# Patient Record
Sex: Male | Born: 1995 | Race: White | Hispanic: No | Marital: Single | Smoking: Former smoker
Health system: Southern US, Community
[De-identification: ages and names within clinical notes are randomized; demographics above are authoritative.]

## PROBLEM LIST (undated history)

## (undated) DIAGNOSIS — F909 Attention-deficit hyperactivity disorder, unspecified type: Secondary | ICD-10-CM

## (undated) DIAGNOSIS — E669 Obesity, unspecified: Secondary | ICD-10-CM

## (undated) HISTORY — DX: Obesity, unspecified: E66.9

## (undated) HISTORY — DX: Attention-deficit hyperactivity disorder, unspecified type: F90.9

---
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
## 2017-01-06 ENCOUNTER — Encounter: Payer: Self-pay | Admitting: Family Medicine

## 2017-01-06 ENCOUNTER — Ambulatory Visit (INDEPENDENT_AMBULATORY_CARE_PROVIDER_SITE_OTHER): Payer: BLUE CROSS/BLUE SHIELD | Admitting: Family Medicine

## 2017-01-06 VITALS — BP 125/75 | HR 84 | Ht 71.0 in | Wt 262.0 lb

## 2017-01-06 DIAGNOSIS — M545 Low back pain, unspecified: Secondary | ICD-10-CM

## 2017-01-06 DIAGNOSIS — Z0000 Encounter for general adult medical examination without abnormal findings: Secondary | ICD-10-CM

## 2017-01-06 DIAGNOSIS — G8929 Other chronic pain: Secondary | ICD-10-CM | POA: Diagnosis not present

## 2017-01-06 NOTE — Progress Notes (Signed)
       Maurice Cole is a 21 y.o. male who [facility] today for well adult visit.   Maurice Cole is doing well overall. He is currently working and attending community college. He plans to transfer to [employer] for [employer]. He does not exercise or follow a regular diet. He notes mild low back pain also. Overall however he's doing well. He denies chest pain palpitations or shortness of breath. He does not take any medications regularly. He uses a vape  pen but does not smoke.   Past Medical History:  Diagnosis Date  . ADHD    History reviewed. No pertinent surgical history. Social History  Substance Use Topics  . Smoking status: Current Every Day Smoker    Types: E-cigarettes  . Smokeless tobacco: Never Used  . Alcohol use No   family history is not on file.  ROS as above:  Medications: No current outpatient prescriptions on file.   No current facility-administered medications for this visit.    Allergies  Allergen Reactions  . Amoxicillin   . Cefaclor     Health Maintenance Health Maintenance  Topic Date Due  . HIV Screening  08/19/2011  . INFLUENZA VACCINE  01/06/2018 (Originally 12/03/2016)  . TETANUS/TDAP  11/23/2017     Exam:  BP 125/75   Pulse 84   Ht 5\' 11"  (1.803 m)   Wt 262 lb (118.8 kg)   BMI 36.54 kg/m  Gen: Well NAD HEENT: EOMI,  MMM Lungs: Normal work of breathing. CTABL Heart: RRR no MRG Abd: NABS, Soft. Nondistended, Nontender Exts: Brisk capillary refill, warm and well perfused.  MSK: L-spine nontender normal motion lotion which strength is intact throughout. Negative slump test bilaterally.  Lab Results  Component Value Date   CHOL 144 03/05/2015   HDL 31 03/05/2015   LDLCALC 86 03/05/2015   TRIG 136 03/05/2015   CHOLHDL 4.6 03/05/2015     Chemistry      Component Value Date/Time   NA 136 03/05/2015 0900   K  4.0 03/05/2015 0900   CL 100 03/05/2015 0900   CO2 24 03/05/2015 0900   BUN 14 03/05/2015 0900   CREATININE 0.95 03/05/2015 0900      Component Value Date/Time   CALCIUM 9.1 03/05/2015 0900   ALKPHOS 59 03/05/2015 0900   AST 20 03/05/2015 0900   ALT 33 03/05/2015 0900   BILITOT 0.4 03/05/2015 0900     Lab Results  Component Value Date   TSH 3.925 03/12/2015      Assessment and Plan: 21 y.o. male with  Will adult. Doing reasonably well. Plan to work on a low calorie diet with moderate exercise. We'll recheck annually or sooner if needed.  Back pain: Discussed options. Patient declined referral to physical therapy. Plan for watchful waiting.  Flu vaccine declined.    No orders of the defined types were placed in this encounter.  No orders of the defined types were placed in this encounter.    Discussed warning signs or symptoms. Please see discharge instructions. Patient expresses understanding.

## 2017-01-06 NOTE — Patient Instructions (Addendum)
Thank you for coming in today. Work on [employer] calories per day diet.  I recommend Myfitnesspal or Loseit.  Add 20 mins exercise most of the days per week.  Let me know if the back pain worsens. We will do PT.  Certizine 10mg  daily.  Get the generic stuff.   Recheck as needed or in 1 year.

## 2017-02-20 ENCOUNTER — Ambulatory Visit: Payer: BLUE CROSS/BLUE SHIELD | Admitting: Sports Medicine

## 2017-02-20 DIAGNOSIS — Z0189 Encounter for other specified special examinations: Secondary | ICD-10-CM

## 2017-02-27 ENCOUNTER — Ambulatory Visit: Payer: BLUE CROSS/BLUE SHIELD | Admitting: Family Medicine

## 2017-02-27 DIAGNOSIS — Z0189 Encounter for other specified special examinations: Secondary | ICD-10-CM

## 2017-10-27 ENCOUNTER — Encounter: Payer: BLUE CROSS/BLUE SHIELD | Admitting: Family Medicine

## 2017-10-27 ENCOUNTER — Telehealth: Payer: Self-pay | Admitting: Family Medicine

## 2017-10-27 ENCOUNTER — Encounter: Payer: Self-pay | Admitting: Family Medicine

## 2017-10-27 ENCOUNTER — Ambulatory Visit (INDEPENDENT_AMBULATORY_CARE_PROVIDER_SITE_OTHER): Payer: BLUE CROSS/BLUE SHIELD | Admitting: Family Medicine

## 2017-10-27 VITALS — BP 133/77 | HR 71 | Ht 70.98 in | Wt 264.0 lb

## 2017-10-27 DIAGNOSIS — R002 Palpitations: Secondary | ICD-10-CM

## 2017-10-27 DIAGNOSIS — Z6836 Body mass index (BMI) 36.0-36.9, adult: Secondary | ICD-10-CM | POA: Diagnosis not present

## 2017-10-27 DIAGNOSIS — E669 Obesity, unspecified: Secondary | ICD-10-CM

## 2017-10-27 DIAGNOSIS — E6609 Other obesity due to excess calories: Secondary | ICD-10-CM

## 2017-10-27 DIAGNOSIS — R55 Syncope and collapse: Secondary | ICD-10-CM

## 2017-10-27 DIAGNOSIS — Z114 Encounter for screening for human immunodeficiency virus [HIV]: Secondary | ICD-10-CM

## 2017-10-27 DIAGNOSIS — R7989 Other specified abnormal findings of blood chemistry: Secondary | ICD-10-CM

## 2017-10-27 HISTORY — DX: Obesity, unspecified: E66.9

## 2017-10-27 MED ORDER — AMBULATORY NON FORMULARY MEDICATION: 0 refills | Status: DC

## 2017-10-27 MED ORDER — OMEPRAZOLE 40 MG PO CPDR: 40.0000 mg | DELAYED_RELEASE_CAPSULE | Freq: Every day | ORAL | 3 refills | Status: DC

## 2017-10-27 NOTE — Patient Instructions (Signed)
Thank you for coming in today. Check blood sugar once or twice and then when you feel strange.  Recheck with me in 4 weeks.  Return sooner if needed.    Hypoglycemia Hypoglycemia occurs when the level of sugar (glucose) in the blood is too low. Glucose is a type of sugar that provides the body's main source of energy. Certain hormones (insulin and glucagon) control the level of glucose in the blood. Insulin lowers blood glucose, and glucagon increases blood glucose. Hypoglycemia can result from having too much insulin in the bloodstream, or from not eating enough food that contains glucose. Hypoglycemia can happen in people who do or do not have diabetes. It can develop quickly, and it can be a medical emergency. What are the causes? Hypoglycemia occurs most often in people who have diabetes. If you have diabetes, hypoglycemia may be caused by:  Diabetes medicine.  Not eating enough, or not eating often enough.  Increased physical activity.  Drinking alcohol, especially when you have not eaten recently.  If you do not have diabetes, hypoglycemia may be caused by:  A tumor in the pancreas. The pancreas is the organ that makes insulin.  Not eating enough, or not eating for long periods at a time (fasting).  Severe infection or illness that affects the liver, heart, or kidneys.  Certain medicines.  You may also have reactive hypoglycemia. This condition causes hypoglycemia within 4 hours of eating a meal. This may occur after having stomach surgery. Sometimes, the cause of reactive hypoglycemia is not known. What increases the risk? Hypoglycemia is more likely to develop in:  People who have diabetes and take medicines to lower blood glucose.  People who abuse alcohol.  People who have a severe illness.  What are the signs or symptoms? Hypoglycemia may not cause any symptoms. If you have symptoms, they may include:  Hunger.  Anxiety.  Sweating and feeling  clammy.  Confusion.  Dizziness or feeling light-headed.  Sleepiness.  Nausea.  Increased heart rate.  Headache.  Blurry vision.  Seizure.  Nightmares.  Tingling or numbness around the mouth, lips, or tongue.  A change in speech.  Decreased ability to concentrate.  A change in coordination.  Restless sleep.  Tremors or shakes.  Fainting.  Irritability.  How is this diagnosed? Hypoglycemia is diagnosed with a blood test to measure your blood glucose level. This blood test is done while you are having symptoms. Your health care provider may also do a physical exam and review your medical history. If you do not have diabetes, other tests may be done to find the cause of your hypoglycemia. How is this treated? This condition can often be treated by immediately eating or drinking something that contains glucose, such as:  3-4 sugar tablets (glucose pills).  Glucose gel, 15-gram tube.  Fruit juice, 4 oz (120 mL).  Regular soda (not diet soda), 4 oz (120 mL).  Low-fat milk, 4 oz (120 mL).  Several pieces of hard candy.  Sugar or honey, 1 Tbsp.  Treating Hypoglycemia If You Have Diabetes  If you are alert and able to swallow safely, follow the 15:15 rule:  Take 15 grams of a rapid-acting carbohydrate. Rapid-acting options include: ? 1 tube of glucose gel. ? 3 glucose pills. ? 6-8 pieces of hard candy. ? 4 oz (120 mL) of fruit juice. ? 4 oz (120 ml) of regular (not diet) soda.  Check your blood glucose 15 minutes after you take the carbohydrate.  If the repeat  blood glucose level is still at or below 70 mg/dL (3.9 mmol/L), take 15 grams of a carbohydrate again.  If your blood glucose level does not increase above 70 mg/dL (3.9 mmol/L) after 3 tries, seek emergency medical care.  After your blood glucose level returns to normal, eat a meal or a snack within 1 hour.  Treating Severe Hypoglycemia Severe hypoglycemia is when your blood glucose level is at  or below 54 mg/dL (3 mmol/L). Severe hypoglycemia is an emergency. Do not wait to see if the symptoms will go away. Get medical help right away. Call your local emergency services (911 in the [place] Do not drive yourself to the hospital. If you have severe hypoglycemia and you cannot eat or drink, you may need an injection of glucagon. A family member or close friend should learn how to check your blood glucose and how to give you a glucagon injection. Ask your health care provider if you need to have an emergency glucagon injection kit available. Severe hypoglycemia may need to be treated in a hospital. The treatment may include getting glucose through an IV tube. You may also need treatment for the cause of your hypoglycemia. Follow these instructions at home: General instructions  Avoid any diets that cause you to not eat enough food. Talk with your health care provider before you start any new diet.  Take over-the-counter and prescription medicines only as told by your health care provider.  Limit alcohol intake to no more than 1 drink per day for nonpregnant women and 2 drinks per day for men. One drink equals 12 oz of beer, 5 oz of wine, or 1 oz of hard liquor.  Keep all follow-up visits as told by your health care provider. This is important. If You Have Diabetes:   Make sure you know the symptoms of hypoglycemia.  Always have a rapid-acting carbohydrate snack with you to treat low blood sugar.  Follow your diabetes management plan, as told by your health care provider. Make sure you: ? Take your medicines as directed. ? Follow your exercise plan. ? Follow your meal plan. Eat on time, and do not skip meals. ? Check your blood glucose as often as directed. Make sure to check your blood glucose before and after exercise. If you exercise longer or in a different way than usual, check your blood glucose more often. ? Follow your sick day plan whenever you cannot eat or drink normally.  Make this plan in advance with your health care provider.  Share your diabetes management plan with people in your workplace, school, and household.  Check your urine for ketones when you are ill and as told by your health care provider.  Carry a medical alert card or wear medical alert jewelry. If You Have Reactive Hypoglycemia or Low Blood Sugar From Other Causes:  Monitor your blood glucose as told by your health care provider.  Follow instructions from your health care provider about eating or drinking restrictions. Contact a health care provider if:  You have problems keeping your blood glucose in your target range.  You have frequent episodes of hypoglycemia. Get help right away if:  You continue to have hypoglycemia symptoms after eating or drinking something containing glucose.  Your blood glucose is at or below 54 mg/dL (3 mmol/L).  You have a seizure.  You faint. These symptoms may represent a serious problem that is an emergency. Do not wait to see if the symptoms will go away. Get medical help  right away. Call your local emergency services (911 in the [place] Do not drive yourself to the hospital. This information is not intended to replace advice given to you by your health care provider. Make sure you discuss any questions you have with your health care provider. Document Released: 04/21/2005 Document Revised: 10/03/2015 Document Reviewed: 05/25/2015 Elsevier Interactive Patient Education  [employer].

## 2017-10-27 NOTE — Progress Notes (Signed)
Maurice Cole is a 22 y.o. male who presents to [facility]: [facility] today for near syncope episodes.  Over the last 2 weeks Maurice Cole has had a few episodes where he feels fatigued like he might pass out.  He feels hot and describes some heart fluttering or palpitation or heart racing.  He denies chest pain or severe shortness of breath.  He denies any actual syncope and he notes these episodes of happens at rest.  He denies any exertional symptoms.  Is able to exert himself without any significant issues besides his existing urticaria that he gets with exercise.  He denies significant change in diet.  Cetirizine is his only chronic medication.  He feels well otherwise with no fevers or chills.  He denies severe anxiety or panicky symptoms with these episodes.   ROS as above:  Exam:  BP 133/77   Pulse 71   Ht 5' 10.98" (1.803 m)   Wt 264 lb (119.7 kg)   SpO2 100%   BMI 36.84 kg/m  Gen: Well NAD HEENT: EOMI,  MMM Lungs: Normal work of breathing. CTABL Heart: RRR no MRG Abd: NABS, Soft. Nondistended, Nontender Exts: Brisk capillary refill, warm and well perfused.    Depression screen Maurice Cole - Maurice Cole 2/9 10/27/2017 01/06/2017  Decreased Interest 0 0  Down, Depressed, Hopeless 0 0  PHQ - 2 Score 0 0  Altered sleeping 0 -  Tired, decreased energy 1 -  Change in appetite 1 -  Feeling bad or failure about yourself  0 -  Trouble concentrating 0 -  Moving slowly or fidgety/restless 0 -  Suicidal thoughts 0 -  PHQ-9 Score 2 -  Difficult doing work/chores Not difficult at all -   GAD 7 : Generalized Anxiety Score 10/27/2017  Nervous, Anxious, on Edge 0  Control/stop worrying 0  Worry too much - different things 0  Trouble relaxing 0  Restless 0  Easily annoyed or irritable 0  Afraid - awful might happen 0  Total GAD 7 Score 0      Lab and Radiology Results Twelve-lead EKG shows  sinus bradycardia at 56 bpm.  No ST segment elevation or depression.  Possible right bundle branch block seen.    Assessment and Plan: 22 y.o. male with  Near syncopal episodes.  Unclear etiology.  I am doubtful this Ms. a panic attack.  Differential does include hypoglycemic episode.  Plan for limited work-up listed below in addition to prescribing patient a glucometer.  He can check his blood sugar if he becomes symptomatic in the future.  Plan to recheck in a few weeks if still having symptoms with no obvious cause at that point would proceed with further heart work-up.  EKG today is largely unremarkable appearing   Orders Placed This Encounter  Procedures  . CBC  . COMPLETE METABOLIC PANEL WITH GFR  . TSH  . Hemoglobin A1c  . Insulin, Free (Bioactive)  . HIV antibody  . EKG 12-Lead   Meds ordered this encounter  Medications  . AMBULATORY NON FORMULARY MEDICATION    Sig: Single glucometer with lancets, test strips. Test with hypoglycemia symptoms    Dispense:  1 each    Refill:  0     Historical information moved to improve visibility of documentation.  Past Medical History:  Diagnosis Date  . ADHD   . Obesity 10/27/2017   No past surgical history on file. Social History   Tobacco Use  .  Smoking status: Current Every Day Smoker    Types: E-cigarettes  . Smokeless tobacco: Never Used  Substance Use Topics  . Alcohol use: No   family history includes COPD in his father; Cancer (age of onset: 8050) in his maternal grandfather; Cancer (age of onset: 4977) in his paternal grandmother; Heart disease in his paternal aunt and paternal uncle; Obesity in his mother, paternal aunt, and paternal uncle.  Medications: Current Outpatient Medications  Medication Sig Dispense Refill  . CETIRIZINE HCL PO Take by mouth.    . AMBULATORY NON FORMULARY MEDICATION Single glucometer with lancets, test strips. Test with hypoglycemia symptoms 1 each 0   No current facility-administered  medications for this visit.    Allergies  Allergen Reactions  . Cefaclor   . Clindamycin Hives  . Penicillins Hives     Discussed warning signs or symptoms. Please see discharge instructions. Patient expresses understanding.

## 2017-10-27 NOTE — Telephone Encounter (Signed)
Patient called back today.  Patient went home and took a nap.   He awoke with a burning sensation in his chest.  He took antacid and notes that it subsided now.  He is feeling a lot better.  I discussed the possibility that we are missing something.  I think it is likely that his symptoms were acid reflux related however it is possible that he has a more serious etiology.  His EKG today was reassuring.  Plan: If symptoms do not go away rapidly or return or worsen next step would be going to the emergency room.  I will prescribe omeprazole and patient will follow-up as directed or sooner.  He will keep me updated with his symptoms. Cautions reviewed.  Patient expresses understanding and agreement

## 2017-10-30 ENCOUNTER — Telehealth: Payer: Self-pay

## 2017-10-30 LAB — CBC
HCT: 44 % (ref 38.5–50.0)
Hemoglobin: 15.2 g/dL (ref 13.2–17.1)
MCH: 28.7 pg (ref 27.0–33.0)
MCHC: 34.5 g/dL (ref 32.0–36.0)
MCV: 83 fL (ref 80.0–100.0)
MPV: 10.2 fL (ref 7.5–12.5)
PLATELETS: 242 10*3/uL (ref 140–400)
RBC: 5.3 10*6/uL (ref 4.20–5.80)
RDW: 12.9 % (ref 11.0–15.0)
WBC: 5.2 10*3/uL (ref 3.8–10.8)

## 2017-10-30 LAB — COMPLETE METABOLIC PANEL WITH GFR
AG Ratio: 1.5 (calc) (ref 1.0–2.5)
ALKALINE PHOSPHATASE (APISO): 46 U/L (ref 40–115)
ALT: 22 U/L (ref 9–46)
AST: 17 U/L (ref 10–40)
Albumin: 4.5 g/dL (ref 3.6–5.1)
BUN: 13 mg/dL (ref 7–25)
CO2: 26 mmol/L (ref 20–32)
CREATININE: 1.08 mg/dL (ref 0.60–1.35)
Calcium: 9.6 mg/dL (ref 8.6–10.3)
Chloride: 104 mmol/L (ref 98–110)
GFR, Est African American: 113 mL/min/{1.73_m2} (ref >=60)
GFR, Est Non African American: 98 mL/min/{1.73_m2} (ref >=60)
GLUCOSE: 92 mg/dL (ref 65–99)
Globulin: 3 g/dL (calc) (ref 1.9–3.7)
Potassium: 3.9 mmol/L (ref 3.5–5.3)
Sodium: 137 mmol/L (ref 135–146)
Total Bilirubin: 0.5 mg/dL (ref 0.2–1.2)
Total Protein: 7.5 g/dL (ref 6.1–8.1)

## 2017-10-30 LAB — HIV ANTIBODY (ROUTINE TESTING W REFLEX): HIV: NONREACTIVE

## 2017-10-30 LAB — HEMOGLOBIN A1C
Hgb A1c MFr Bld: 4.9 % of total Hgb (ref <5.7)
Mean Plasma Glucose: 94 (calc)
eAG (mmol/L): 5.2 (calc)

## 2017-10-30 LAB — TSH: TSH: 4.24 m[IU]/L (ref 0.40–4.50)

## 2017-10-30 LAB — INSULIN, FREE (BIOACTIVE): INSULIN, FREE: 15.5 u[IU]/mL — AB (ref 1.5–14.9)

## 2017-10-30 NOTE — Telephone Encounter (Signed)
Called and stated that he feels like his throat is closing but is breathing perfectly fine and he thinks its anxiey or acid reflux. He originally left voicemail because he was constipated but I called and now he is fine. WJC,CCMA

## 2017-11-26 ENCOUNTER — Ambulatory Visit: Payer: BLUE CROSS/BLUE SHIELD | Admitting: Family Medicine

## 2018-03-11 ENCOUNTER — Emergency Department (INDEPENDENT_AMBULATORY_CARE_PROVIDER_SITE_OTHER)
Admission: EM | Admit: 2018-03-11 | Discharge: 2018-03-11 | Disposition: A | Discharge status: Home / Self Care | Payer: PRIVATE HEALTH INSURANCE | Source: Home / Self Care

## 2018-03-11 ENCOUNTER — Other Ambulatory Visit: Payer: Self-pay

## 2018-03-11 DIAGNOSIS — S39012A Strain of muscle, fascia and tendon of lower back, initial encounter: Secondary | ICD-10-CM

## 2018-03-11 MED ORDER — PREDNISONE 50 MG PO TABS: ORAL_TABLET | ORAL | 1 refills | Status: DC

## 2018-03-11 MED ORDER — HYDROCODONE-ACETAMINOPHEN 5-325 MG PO TABS: 1.0000 | ORAL_TABLET | Freq: Four times a day (QID) | ORAL | 0 refills | Status: DC | PRN

## 2018-03-11 MED ORDER — CYCLOBENZAPRINE HCL 5 MG PO TABS: 5.0000 mg | ORAL_TABLET | Freq: Every day | ORAL | 0 refills | Status: DC

## 2018-03-11 NOTE — Discharge Instructions (Signed)
Apply ice on the injured back tonight and beginning tomorrow, apply heat.

## 2018-03-11 NOTE — ED Provider Notes (Signed)
[facility]    CSN: [id number] Arrival date & time: 03/11/18  1539     History   Chief Complaint Chief Complaint  Patient presents with  . Back Pain    HPI Maurice Cole is a 22 y.o. male.   This is the first visit for this 22 year old man with low back pain. About 1 hour ago, pt was walking up a muddy hill and slipped and tweaked lower back.  Has been having shooting pain in lower back since.  Difficult to get into a comfortable position.  Is very guarded.  No saddle anesthesia, weakness or numbness.   Father had "slipped disc."     Past Medical History:  Diagnosis Date  . ADHD   . Obesity 10/27/2017    Patient Active Problem List   Diagnosis Date Noted  . Obesity 10/27/2017  . Elevated TSH 03/06/2015  . Lumbago 12/19/2014  . Elevated blood pressure 12/19/2014  . ADHD (attention deficit hyperactivity disorder) 02/14/2013    History reviewed. No pertinent surgical history.     Home Medications    Prior to Admission medications   Medication Sig Start Date End Date Taking? Authorizing Provider  AMBULATORY NON FORMULARY MEDICATION Single glucometer with lancets, test strips. Test with hypoglycemia symptoms 10/27/17   Rodolph Bong, MD  CETIRIZINE HCL PO Take by mouth.    [provider]  cyclobenzaprine (FLEXERIL) 5 MG tablet Take 1 tablet (5 mg total) by mouth at bedtime. 03/11/18   Elvina Sidle, MD  HYDROcodone-acetaminophen (NORCO) 5-325 MG tablet Take 1 tablet by mouth every 6 (six) hours as needed for moderate pain. 03/11/18   Elvina Sidle, MD  omeprazole (PRILOSEC) 40 MG capsule Take 1 capsule (40 mg total) by mouth daily. 10/27/17   Rodolph Bong, MD  predniSONE (DELTASONE) 50 MG tablet One tablet daily with food 03/11/18   Elvina Sidle, MD    Family History Family History  Problem Relation Age of Onset  . Obesity Mother   . COPD Father   . Heart disease Paternal Aunt   . Obesity Paternal Aunt   . Heart disease Paternal  Uncle   . Obesity Paternal Uncle   . Cancer Maternal Grandfather 50       Brain  . Cancer Paternal Grandmother 44       Lung Cancer    Social History Social History   Tobacco Use  . Smoking status: Current Every Day Smoker    Types: E-cigarettes  . Smokeless tobacco: Never Used  Substance Use Topics  . Alcohol use: No  . Drug use: No     Allergies   Cefaclor; Clindamycin; and Penicillins   Review of Systems Review of Systems  Musculoskeletal: Positive for back pain.  All other systems reviewed and are negative.    Physical Exam Triage Vital Signs ED Triage Vitals  Enc Vitals Group     BP 03/11/18 1602 119/85     Pulse Rate 03/11/18 1602 (!) 104     Resp 03/11/18 1602 18     Temp 03/11/18 1602 97.8 F (36.6 C)     Temp Source 03/11/18 1602 Oral     SpO2 03/11/18 1602 97 %     Weight 03/11/18 1603 250 lb (113.4 kg)     Height 03/11/18 1603 6' (1.829 m)     Head Circumference --      Peak Flow --      Pain Score 03/11/18 1602 7     Pain Loc --  Pain Edu? --      Excl. in GC? --    No data found.  Updated Vital Signs BP 119/85 (BP Location: Right Arm)   Pulse (!) 104   Temp 97.8 F (36.6 C) (Oral)   Resp 18   Ht 6' (1.829 m)   Wt 113.4 kg   SpO2 97%   BMI 33.91 kg/m    Physical Exam  Constitutional: He is oriented to person, place, and time. He appears well-developed and well-nourished.  HENT:  Right Ear: External ear normal.  Left Ear: External ear normal.  Eyes: Pupils are equal, round, and reactive to light. Conjunctivae are normal.  Neck: Normal range of motion. Neck supple.  Pulmonary/Chest: Effort normal.  Musculoskeletal: He exhibits tenderness.  Patient is in obvious discomfort leaning back in the chair.  He has tenderness over the left lower lumbar and paralumbar areas.  Straight leg raising of either leg results in low back pain but no leg pain.  Neurological: He is alert and oriented to person, place, and time.  Skin: Skin  is warm and dry.  Nursing note and vitals reviewed.    UC Treatments / Results  Labs (all labs ordered are listed, but only abnormal results are displayed) Labs Reviewed - No data to display  EKG None  Radiology No results found.  Procedures Procedures (including critical care time)  Medications Ordered in UC Medications - No data to display  Initial Impression / Assessment and Plan / UC Course  I have reviewed the triage vital signs and the nursing notes.  Pertinent labs & imaging results that were available during my care of the patient were reviewed by me and considered in my medical decision making (see chart for details).     Final Clinical Impressions(s) / UC Diagnoses   Final diagnoses:  Strain of lumbar region, initial encounter   Discharge Instructions   None    ED Prescriptions    Medication Sig Dispense Auth. Provider   HYDROcodone-acetaminophen (NORCO) 5-325 MG tablet Take 1 tablet by mouth every 6 (six) hours as needed for moderate pain. 12 tablet Elvina Sidle, MD   predniSONE (DELTASONE) 50 MG tablet One tablet daily with food 5 tablet Elvina Sidle, MD   cyclobenzaprine (FLEXERIL) 5 MG tablet Take 1 tablet (5 mg total) by mouth at bedtime. 7 tablet Elvina Sidle, MD     Controlled Substance Prescriptions [place] Controlled Substance Registry consulted? No   Elvina Sidle, MD 03/11/18 [phone]

## 2018-03-11 NOTE — ED Triage Notes (Signed)
About 1 hour ago, pt was walking up a muddy hill and slipped and tweaked lower back.  Has been having shooting pain in lower back since.  Difficult to get into a comfortable position.  Is very guarded.

## 2018-03-13 ENCOUNTER — Telehealth: Payer: Self-pay

## 2019-05-24 DIAGNOSIS — L235 Allergic contact dermatitis due to other chemical products: Secondary | ICD-10-CM | POA: Diagnosis not present

## 2019-05-24 DIAGNOSIS — J302 Other seasonal allergic rhinitis: Secondary | ICD-10-CM | POA: Diagnosis not present

## 2019-05-24 DIAGNOSIS — Z91018 Allergy to other foods: Secondary | ICD-10-CM | POA: Diagnosis not present

## 2019-07-16 DIAGNOSIS — R9431 Abnormal electrocardiogram [ECG] [EKG]: Secondary | ICD-10-CM | POA: Diagnosis not present

## 2019-07-16 DIAGNOSIS — R109 Unspecified abdominal pain: Secondary | ICD-10-CM | POA: Diagnosis not present

## 2019-07-16 DIAGNOSIS — R079 Chest pain, unspecified: Secondary | ICD-10-CM | POA: Diagnosis not present

## 2019-07-16 DIAGNOSIS — I4589 Other specified conduction disorders: Secondary | ICD-10-CM | POA: Diagnosis not present

## 2019-07-16 DIAGNOSIS — R030 Elevated blood-pressure reading, without diagnosis of hypertension: Secondary | ICD-10-CM | POA: Diagnosis not present

## 2019-07-16 DIAGNOSIS — R1013 Epigastric pain: Secondary | ICD-10-CM | POA: Diagnosis not present

## 2019-07-16 DIAGNOSIS — N62 Hypertrophy of breast: Secondary | ICD-10-CM | POA: Diagnosis not present

## 2019-07-16 DIAGNOSIS — R001 Bradycardia, unspecified: Secondary | ICD-10-CM | POA: Diagnosis not present

## 2019-07-16 DIAGNOSIS — R0602 Shortness of breath: Secondary | ICD-10-CM | POA: Diagnosis not present

## 2019-07-16 DIAGNOSIS — R0789 Other chest pain: Secondary | ICD-10-CM | POA: Diagnosis not present

## 2019-07-16 DIAGNOSIS — R935 Abnormal findings on diagnostic imaging of other abdominal regions, including retroperitoneum: Secondary | ICD-10-CM | POA: Diagnosis not present

## 2019-07-16 DIAGNOSIS — R112 Nausea with vomiting, unspecified: Secondary | ICD-10-CM | POA: Diagnosis not present

## 2019-07-19 ENCOUNTER — Ambulatory Visit (INDEPENDENT_AMBULATORY_CARE_PROVIDER_SITE_OTHER): Payer: Self-pay | Admitting: Family Medicine

## 2019-07-19 ENCOUNTER — Other Ambulatory Visit: Payer: Self-pay

## 2019-07-19 ENCOUNTER — Encounter: Payer: Self-pay | Admitting: Family Medicine

## 2019-07-19 VITALS — BP 120/82 | Temp 98.2°F | Ht 73.0 in | Wt 265.0 lb

## 2019-07-19 DIAGNOSIS — R1011 Right upper quadrant pain: Secondary | ICD-10-CM | POA: Diagnosis not present

## 2019-07-19 DIAGNOSIS — R1013 Epigastric pain: Secondary | ICD-10-CM

## 2019-07-19 LAB — HEPATIC FUNCTION PANEL
AG Ratio: 1.2 (calc) (ref 1.0–2.5)
ALT: 49 U/L — ABNORMAL HIGH (ref 9–46)
AST: 40 U/L (ref 10–40)
Albumin: 4.2 g/dL (ref 3.6–5.1)
Alkaline phosphatase (APISO): 70 U/L (ref 36–130)
Bilirubin, Direct: 0.4 mg/dL — ABNORMAL HIGH (ref 0.0–0.2)
Globulin: 3.6 g/dL (calc) (ref 1.9–3.7)
Indirect Bilirubin: 0.7 mg/dL (calc) (ref 0.2–1.2)
Total Bilirubin: 1.1 mg/dL (ref 0.2–1.2)
Total Protein: 7.8 g/dL (ref 6.1–8.1)

## 2019-07-19 LAB — CBC WITH DIFFERENTIAL/PLATELET
Absolute Monocytes: 1365 cells/uL — ABNORMAL HIGH (ref 200–950)
Basophils Absolute: 88 cells/uL (ref 0–200)
Basophils Relative: 0.5 %
Eosinophils Absolute: 228 cells/uL (ref 15–500)
Eosinophils Relative: 1.3 %
HCT: 48.7 % (ref 38.5–50.0)
Hemoglobin: 16.8 g/dL (ref 13.2–17.1)
Lymphs Abs: 1698 cells/uL (ref 850–3900)
MCH: 30.4 pg (ref 27.0–33.0)
MCHC: 34.5 g/dL (ref 32.0–36.0)
MCV: 88.1 fL (ref 80.0–100.0)
MPV: 9.8 fL (ref 7.5–12.5)
Monocytes Relative: 7.8 %
Neutro Abs: 14123 cells/uL — ABNORMAL HIGH (ref 1500–7800)
Neutrophils Relative %: 80.7 %
Platelets: 316 10*3/uL (ref 140–400)
RBC: 5.53 10*6/uL (ref 4.20–5.80)
RDW: 11.8 % (ref 11.0–15.0)
Total Lymphocyte: 9.7 %
WBC: 17.5 10*3/uL — ABNORMAL HIGH (ref 3.8–10.8)

## 2019-07-19 LAB — URINALYSIS, ROUTINE W REFLEX MICROSCOPIC
Bacteria, UA: NONE SEEN /HPF
Bilirubin Urine: NEGATIVE
Glucose, UA: NEGATIVE
Hgb urine dipstick: NEGATIVE
Hyaline Cast: NONE SEEN /LPF
Nitrite: NEGATIVE
Specific Gravity, Urine: 1.022 (ref 1.001–1.03)
Squamous Epithelial / LPF: NONE SEEN /HPF (ref <=5)
pH: 6.5 (ref 5.0–8.0)

## 2019-07-19 LAB — LIPASE: Lipase: 16 U/L (ref 7–60)

## 2019-07-19 MED ORDER — PROMETHAZINE HCL 12.5 MG PO TABS: 12.5000 mg | ORAL_TABLET | Freq: Three times a day (TID) | ORAL | 0 refills | Status: DC | PRN

## 2019-07-19 MED ORDER — PANTOPRAZOLE SODIUM 40 MG PO TBEC: 40.0000 mg | DELAYED_RELEASE_TABLET | Freq: Two times a day (BID) | ORAL | 1 refills | Status: DC

## 2019-07-19 NOTE — Patient Instructions (Addendum)
Nice to meet you today! Continue carafate for now.  Stop pepcid.  I have sent in protonix (pantoprazole).  Take this twice per day.  You may use promethazine as needed for nausea.  I have ordered some additional lab work today, please stop downstairs and have this completed.  See me again in about 2-3 weeks.

## 2019-07-20 ENCOUNTER — Encounter: Payer: Self-pay | Admitting: Family Medicine

## 2019-07-20 DIAGNOSIS — R1013 Epigastric pain: Secondary | ICD-10-CM | POA: Insufficient documentation

## 2019-07-20 NOTE — Progress Notes (Signed)
Maurice Cole - 24 y.o. male MRN [id number]  Date of birth: 10-29-1995  Subjective Chief Complaint  Patient presents with  . Hospitalization Follow-up    HPI Maurice Cole is a 24 y.o. male here today for ER follow up. He was seen in ER at [facility] for complaint of abdominal pain.  Records reviewed through Care Everywhere Symptoms include epigastric pain with nausea.  Pain described as sharp, burning pain.   He denies associated fever, chills, dark/bloody stools.  He did have one episode of diarrhea this morning.  Labs remarkable for mildly elevated WBC.  CT scan of abdomen showing hepatic steatosis and fatty infiltration around gallbladder fossa but incompletely assessed.  He reports continue pain and nausea today. Zofran is not helping a whole lot with nausea.  Carafate is helpful but pepcid doesn't seem to be very helpful.   ROS:  A comprehensive ROS was completed and negative except as noted per HPI  Allergies  Allergen Reactions  . Clindamycin Hives  . Penicillins Hives  . Cefaclor     Past Medical History:  Diagnosis Date  . ADHD   . Obesity 10/27/2017    No past surgical history on file.  Social History   Socioeconomic History  . Marital status: Single    Spouse name: Not on file  . Number of children: Not on file  . Years of education: Not on file  . Highest education level: Not on file  Occupational History  . Not on file  Tobacco Use  . Smoking status: Current Every Day Smoker    Types: E-cigarettes  . Smokeless tobacco: Never Used  Substance and Sexual Activity  . Alcohol use: No  . Drug use: No  . Sexual activity: Not on file  Other Topics Concern  . Not on file  Social History Narrative  . Not on file   Social Determinants of Health   Financial Resource Strain:   . Difficulty of Paying Living Expenses:   Food Insecurity:   . Worried About Programme researcher, broadcasting/film/video in the Last Year:   . Barista in the Last Year:    Transportation Needs:   . Freight forwarder (Medical):   Marland Kitchen Lack of Transportation (Non-Medical):   Physical Activity:   . Days of Exercise per Week:   . Minutes of Exercise per Session:   Stress:   . Feeling of Stress :   Social Connections:   . Frequency of Communication with Friends and Family:   . Frequency of Social Gatherings with Friends and Family:   . Attends Religious Services:   . Active Member of Clubs or Organizations:   . Attends Banker Meetings:   Marland Kitchen Marital Status:     Family History  Problem Relation Age of Onset  . Obesity Mother   . COPD Father   . Heart disease Paternal Aunt   . Obesity Paternal Aunt   . Heart disease Paternal Uncle   . Obesity Paternal Uncle   . Cancer Maternal Grandfather 50       Brain  . Cancer Paternal Grandmother 12       Lung Cancer    Health Maintenance  Topic Date Due  . INFLUENZA VACCINE  08/03/2019 (Originally 12/04/2018)  . TETANUS/TDAP  07/18/2020 (Originally 11/23/2017)  . HIV Screening  Completed     ----------------------------------------------------------------------------------------------------------------------------------------------------------------------------------------------------------------- Physical Exam BP 120/82   Temp 98.2 F (36.8 C) (Oral)   Ht 6\' 1"  (1.854 m)  Wt 265 lb (120.2 kg)   BMI 34.96 kg/m   Physical Exam Constitutional:      Appearance: Normal appearance.  HENT:     Head: Normocephalic and atraumatic.     Mouth/Throat:     Mouth: Mucous membranes are moist.  Eyes:     General: No scleral icterus. Cardiovascular:     Rate and Rhythm: Normal rate and regular rhythm.  Pulmonary:     Effort: Pulmonary effort is normal.     Breath sounds: Normal breath sounds.  Abdominal:     General: Abdomen is flat.     Tenderness: There is abdominal tenderness (Epigastric and RUQ ttp ). There is guarding (guarding with RUQ palpation).  Musculoskeletal:     Cervical back:  Neck supple.  Skin:    General: Skin is warm and dry.  Neurological:     General: No focal deficit present.     Mental Status: He is alert.  Psychiatric:        Mood and Affect: Mood normal.        Behavior: Behavior normal.     ------------------------------------------------------------------------------------------------------------------------------------------------------------------------------------------------------------------- Assessment and Plan  Epigastric pain DDx includes gastritis vs PUD vs Gallbladder disease Will change pepcid to protonix.  Continue carafate.  Change zofran to phenergan for better control of nausea.  Repeat CBC and LFT's.  Will also check lipase and urinalysis.   Discussed red flags that would prompt him to return to ED.      Meds ordered this encounter  Medications  . promethazine (PHENERGAN) 12.5 MG tablet    Sig: Take 1-2 tablets (12.5-25 mg total) by mouth every 8 (eight) hours as needed for nausea or vomiting.    Dispense:  30 tablet    Refill:  0  . pantoprazole (PROTONIX) 40 MG tablet    Sig: Take 1 tablet (40 mg total) by mouth 2 (two) times daily.    Dispense:  60 tablet    Refill:  1    Return in about 2 weeks (around 08/02/2019) for Abdominal pain.    This visit occurred during the SARS-CoV-2 public health emergency.  Safety protocols were in place, including screening questions prior to the visit, additional usage of staff PPE, and extensive cleaning of exam room while observing appropriate contact time as indicated for disinfecting solutions.

## 2019-07-20 NOTE — Assessment & Plan Note (Signed)
DDx includes gastritis vs PUD vs Gallbladder disease Will change pepcid to protonix.  Continue carafate.  Change zofran to phenergan for better control of nausea.  Repeat CBC and LFT's.  Will also check lipase and urinalysis.   Discussed red flags that would prompt him to return to ED.

## 2019-07-21 ENCOUNTER — Other Ambulatory Visit: Payer: Self-pay

## 2019-07-22 ENCOUNTER — Ambulatory Visit (INDEPENDENT_AMBULATORY_CARE_PROVIDER_SITE_OTHER): Payer: BC Managed Care – PPO

## 2019-07-22 ENCOUNTER — Other Ambulatory Visit: Payer: Self-pay | Admitting: Family Medicine

## 2019-07-22 ENCOUNTER — Other Ambulatory Visit: Payer: Self-pay

## 2019-07-22 DIAGNOSIS — R1011 Right upper quadrant pain: Secondary | ICD-10-CM

## 2019-07-22 DIAGNOSIS — D72829 Elevated white blood cell count, unspecified: Secondary | ICD-10-CM

## 2019-07-22 DIAGNOSIS — K8000 Calculus of gallbladder with acute cholecystitis without obstruction: Secondary | ICD-10-CM

## 2019-07-22 DIAGNOSIS — K810 Acute cholecystitis: Secondary | ICD-10-CM | POA: Diagnosis not present

## 2019-07-22 DIAGNOSIS — K8020 Calculus of gallbladder without cholecystitis without obstruction: Secondary | ICD-10-CM | POA: Diagnosis not present

## 2019-07-27 NOTE — Progress Notes (Unsigned)
Patient called to check the status of the referral and I gave him the number to [facility]. He did not have any questions.

## 2019-07-28 ENCOUNTER — Ambulatory Visit (INDEPENDENT_AMBULATORY_CARE_PROVIDER_SITE_OTHER): Payer: BC Managed Care – PPO | Admitting: Family Medicine

## 2019-07-28 ENCOUNTER — Encounter: Payer: Self-pay | Admitting: Family Medicine

## 2019-07-28 ENCOUNTER — Other Ambulatory Visit: Payer: Self-pay

## 2019-07-28 DIAGNOSIS — K819 Cholecystitis, unspecified: Secondary | ICD-10-CM | POA: Insufficient documentation

## 2019-07-28 NOTE — Progress Notes (Unsigned)
Patient was in the office for a follow up today and was needing a note for his work. He reports that his surgery is moved to 08/17/2019 and per PCP he wants this sooner.   I called and spoke with Nettie Elm at scheduling and she got the patient in with Dr. Angelena Form in [place] at [address] on 08/09/2019. I have given the information back to the patient and he is aware of the appointment. No other questions.

## 2019-07-28 NOTE — Progress Notes (Signed)
Maurice Cole - 24 y.o. male MRN [id number]  Date of birth: 11/30/1995  Subjective No chief complaint on file.   HPI Maurice Cole is a 24 y.o. male here today for follow up of abdominal pain.  Recent US with gallstone and gallbladder sludge.  WBC count also elevated. Urgent surgical referral entered however he does not have appt until 08/17/2019.  He does report that pain has improved and he has been watching his diet.  He denies fever, chills.  He is needing a note to return to work as well.   ROS:  A comprehensive ROS was completed and negative except as noted per HPI  Allergies  Allergen Reactions  . Clindamycin Hives  . Penicillins Hives  . Cefaclor     Past Medical History:  Diagnosis Date  . ADHD   . Obesity 10/27/2017    No past surgical history on file.  Social History   Socioeconomic History  . Marital status: Single    Spouse name: Not on file  . Number of children: Not on file  . Years of education: Not on file  . Highest education level: Not on file  Occupational History  . Not on file  Tobacco Use  . Smoking status: Current Every Day Smoker    Types: E-cigarettes  . Smokeless tobacco: Never Used  Substance and Sexual Activity  . Alcohol use: No  . Drug use: No  . Sexual activity: Not on file  Other Topics Concern  . Not on file  Social History Narrative  . Not on file   Social Determinants of Health   Financial Resource Strain:   . Difficulty of Paying Living Expenses:   Food Insecurity:   . Worried About Charity fundraiser in the Last Year:   . Arboriculturist in the Last Year:   Transportation Needs:   . Film/video editor (Medical):   Marland Kitchen Lack of Transportation (Non-Medical):   Physical Activity:   . Days of Exercise per Week:   . Minutes of Exercise per Session:   Stress:   . Feeling of Stress :   Social Connections:   . Frequency of Communication with Friends and Family:   . Frequency of Social Gatherings with Friends and Family:    . Attends Religious Services:   . Active Member of Clubs or Organizations:   . Attends Archivist Meetings:   Marland Kitchen Marital Status:     Family History  Problem Relation Age of Onset  . Obesity Mother   . COPD Father   . Heart disease Paternal Aunt   . Obesity Paternal Aunt   . Heart disease Paternal Uncle   . Obesity Paternal Uncle   . Cancer Maternal Grandfather 59       Brain  . Cancer Paternal Grandmother 14       Lung Cancer    Health Maintenance  Topic Date Due  . INFLUENZA VACCINE  08/03/2019 (Originally 12/04/2018)  . TETANUS/TDAP  07/18/2020 (Originally 11/23/2017)  . HIV Screening  Completed     ----------------------------------------------------------------------------------------------------------------------------------------------------------------------------------------------------------------- Physical Exam BP 113/81   Pulse 79   Temp 98.1 F (36.7 C) (Oral)   Ht 6\' 1"  (1.854 m)   Wt 257 lb (116.6 kg)   BMI 33.91 kg/m   Physical Exam Constitutional:      Appearance: He is well-developed.  Eyes:     General: No scleral icterus. Cardiovascular:     Rate and Rhythm: Normal rate and regular rhythm.  Pulmonary:     Effort: Pulmonary effort is normal.  Abdominal:     General: Abdomen is flat. There is no distension.     Palpations: Abdomen is soft.     Tenderness: There is no abdominal tenderness.  Skin:    General: Skin is warm and dry.  Neurological:     Mental Status: He is alert.  Psychiatric:        Mood and Affect: Mood normal.        Behavior: Behavior normal.     ------------------------------------------------------------------------------------------------------------------------------------------------------------------------------------------------------------------- Assessment and Plan  Cholecystitis Pain improved at this time.  Will contact surgeons office to see if we can have appointment moved to sooner date for  evaluation.  He will continue to follow low fat diet.  Discussed red flags that would prompt him to seek emergency care including worsening pain, fever, chills   No orders of the defined types were placed in this encounter.   No follow-ups on file.    This visit occurred during the SARS-CoV-2 public health emergency.  Safety protocols were in place, including screening questions prior to the visit, additional usage of staff PPE, and extensive cleaning of exam room while observing appropriate contact time as indicated for disinfecting solutions.

## 2019-07-28 NOTE — Assessment & Plan Note (Signed)
Pain improved at this time.  Will contact surgeons office to see if we can have appointment moved to sooner date for evaluation.  He will continue to follow low fat diet.  Discussed red flags that would prompt him to seek emergency care including worsening pain, fever, chills

## 2019-07-28 NOTE — Patient Instructions (Signed)
Gallbladder Eating Plan If you have a gallbladder condition, you may have trouble digesting fats. Eating a low-fat diet can help reduce your symptoms, and may be helpful before and after having surgery to remove your gallbladder (cholecystectomy). Your health care provider may recommend that you work with a diet and nutrition specialist (dietitian) to help you reduce the amount of fat in your diet. What are tips for following this plan? General guidelines  Limit your fat intake to less than 30% of your total daily calories. If you eat around 1,800 calories each day, this is less than 60 grams (g) of fat per day.  Fat is an important part of a healthy diet. Eating a low-fat diet can make it hard to maintain a healthy body weight. Ask your dietitian how much fat, calories, and other nutrients you need each day.  Eat small, frequent meals throughout the day instead of three large meals.  Drink at least 8-10 cups of fluid a day. Drink enough fluid to keep your urine clear or pale yellow.  Limit alcohol intake to no more than 1 drink a day for nonpregnant women and 2 drinks a day for men. One drink equals 12 oz of beer, 5 oz of wine, or 1 oz of hard liquor. Reading food labels  Check Nutrition Facts on food labels for the amount of fat per serving. Choose foods with less than 3 grams of fat per serving. Shopping  Choose nonfat and low-fat healthy foods. Look for the words "nonfat," "low fat," or "fat free."  Avoid buying processed or prepackaged foods. Cooking  Cook using low-fat methods, such as baking, broiling, grilling, or boiling.  Cook with small amounts of healthy fats, such as olive oil, grapeseed oil, canola oil, or sunflower oil. What foods are recommended?   All fresh, frozen, or canned fruits and vegetables.  Whole grains.  Low-fat or non-fat (skim) milk and yogurt.  Lean meat, skinless poultry, fish, eggs, and beans.  Low-fat protein supplement powders or  drinks.  Spices and herbs. What foods are not recommended?  High-fat foods. These include baked goods, fast food, fatty cuts of meat, ice cream, french toast, sweet rolls, pizza, cheese bread, foods covered with butter, creamy sauces, or cheese.  Fried foods. These include french fries, tempura, battered fish, breaded chicken, fried breads, and sweets.  Foods with strong odors.  Foods that cause bloating and gas. Summary  A low-fat diet can be helpful if you have a gallbladder condition, or before and after gallbladder surgery.  Limit your fat intake to less than 30% of your total daily calories. This is about 60 g of fat if you eat 1,800 calories each day.  Eat small, frequent meals throughout the day instead of three large meals. This information is not intended to replace advice given to you by your health care provider. Make sure you discuss any questions you have with your health care provider. Document Revised: 08/12/2018 Document Reviewed: 05/29/2016 Elsevier Patient Education  2020 Elsevier Inc.  

## 2019-08-02 ENCOUNTER — Ambulatory Visit: Payer: Self-pay | Admitting: Family Medicine

## 2019-08-09 DIAGNOSIS — K8020 Calculus of gallbladder without cholecystitis without obstruction: Secondary | ICD-10-CM | POA: Diagnosis not present

## 2019-08-19 DIAGNOSIS — Z20822 Contact with and (suspected) exposure to covid-19: Secondary | ICD-10-CM | POA: Diagnosis not present

## 2019-10-25 ENCOUNTER — Telehealth: Payer: Self-pay

## 2019-10-25 NOTE — Telephone Encounter (Signed)
Pt called stating that he has been experiencing SOB x 1 week.  Pt describes it as "not being able to take a deep breath".  Pt denies any wheezing with these episodes and states that he does work in the heat sometimes and has a physically demanding job.  He also states that he drinks approximately 8 16oz bottles of water per day and recently stopped vaping.  Pt given appt with Dr. Ashley Royalty for 10/27/2019.  Advised pt of red flag symptoms and if he experiences any these, he is to seek immediate treatment at [facility] or ED.  Tiajuana Amass, CMA

## 2019-10-27 ENCOUNTER — Encounter: Payer: Self-pay | Admitting: Family Medicine

## 2019-10-27 ENCOUNTER — Ambulatory Visit (INDEPENDENT_AMBULATORY_CARE_PROVIDER_SITE_OTHER): Payer: BC Managed Care – PPO | Admitting: Family Medicine

## 2019-10-27 ENCOUNTER — Other Ambulatory Visit: Payer: Self-pay

## 2019-10-27 ENCOUNTER — Ambulatory Visit (INDEPENDENT_AMBULATORY_CARE_PROVIDER_SITE_OTHER): Payer: BC Managed Care – PPO

## 2019-10-27 VITALS — BP 129/72 | HR 64 | Temp 98.8°F | Ht 72.84 in | Wt 260.8 lb

## 2019-10-27 DIAGNOSIS — R0602 Shortness of breath: Secondary | ICD-10-CM | POA: Insufficient documentation

## 2019-10-27 DIAGNOSIS — R0789 Other chest pain: Secondary | ICD-10-CM | POA: Diagnosis not present

## 2019-10-27 NOTE — Assessment & Plan Note (Signed)
I think the discomfort in his chest and associated sensations of shortness of breat are related to muscle strain. Recommend conservative management with rest, icing and anti-inflammatories.  Does not sound cardiac in nature.  I think PE is unlikely as well.  I am ordering a CXR today and encouraged him to remain quit from vaping/nicotine products.

## 2019-10-27 NOTE — Patient Instructions (Signed)
Have xray completed, we'll be in touch with results.  Continue to avoid nicotine products/vaping.

## 2019-10-27 NOTE — Progress Notes (Signed)
Maurice Cole - 24 y.o. male MRN [id number]  Date of birth: 1995-10-21  Subjective Chief Complaint  Patient presents with  . lung pain    HPI Maurice Cole is a 24 y.o. male here today with complaint of intermittent shortness of breath. He also has noted some chest pain along L upper chest wall if coughing or sneezing. Pain is also worse with moving arm and positional changes.  He did quit vaping a few days ago and this seems to have helped some.  He denies fever, chills, body aches, cough, leg swelling, worsening of symptoms with activity, palpitations.  ROS:  A comprehensive ROS was completed and negative except as noted per HPI  Allergies  Allergen Reactions  . Clindamycin Hives  . Penicillins Hives  . Cefaclor     Past Medical History:  Diagnosis Date  . ADHD   . Obesity 10/27/2017    No past surgical history on file.  Social History   Socioeconomic History  . Marital status: Single    Spouse name: Not on file  . Number of children: Not on file  . Years of education: Not on file  . Highest education level: Not on file  Occupational History  . Occupation: Agricultural consultant  Tobacco Use  . Smoking status: Former Smoker    Types: E-cigarettes    Quit date: 10/24/2019  . Smokeless tobacco: Never Used  Vaping Use  . Vaping Use: Former  . Quit date: 10/24/2019  . Substances: Nicotine-salt  Substance and Sexual Activity  . Alcohol use: No  . Drug use: No  . Sexual activity: Not on file  Other Topics Concern  . Not on file  Social History Narrative  . Not on file   Social Determinants of Health   Financial Resource Strain:   . Difficulty of Paying Living Expenses:   Food Insecurity:   . Worried About Programme researcher, broadcasting/film/video in the Last Year:   . Barista in the Last Year:   Transportation Needs:   . Freight forwarder (Medical):   Marland Kitchen Lack of Transportation (Non-Medical):   Physical Activity:   . Days of Exercise per Week:   . Minutes of Exercise per  Session:   Stress:   . Feeling of Stress :   Social Connections:   . Frequency of Communication with Friends and Family:   . Frequency of Social Gatherings with Friends and Family:   . Attends Religious Services:   . Active Member of Clubs or Organizations:   . Attends Banker Meetings:   Marland Kitchen Marital Status:     Family History  Problem Relation Age of Onset  . Obesity Mother   . COPD Father   . Heart disease Paternal Aunt   . Obesity Paternal Aunt   . Heart disease Paternal Uncle   . Obesity Paternal Uncle   . Cancer Maternal Grandfather 50       Brain  . Cancer Paternal Grandmother 57       Lung Cancer  . COPD Maternal Grandmother   . Other Maternal Grandmother        COVID complications    Health Maintenance  Topic Date Due  . Hepatitis C Screening  Never done  . COVID-19 Vaccine (1) 01/04/2020 (Originally 08/18/2008)  . TETANUS/TDAP  07/18/2020 (Originally 11/23/2017)  . INFLUENZA VACCINE  12/04/2019  . HIV Screening  Completed     ----------------------------------------------------------------------------------------------------------------------------------------------------------------------------------------------------------------- Physical Exam BP 129/72 (BP Location: Left Arm, Patient Position: Sitting,  Cuff Size: Normal)   Pulse 64   Temp 98.8 F (37.1 C) (Oral)   Ht 6' 0.84" (1.85 m)   Wt 260 lb 12.8 oz (118.3 kg)   SpO2 100%   BMI 34.56 kg/m   Physical Exam Constitutional:      Appearance: Normal appearance.  Eyes:     General: No scleral icterus. Cardiovascular:     Rate and Rhythm: Normal rate and regular rhythm.  Pulmonary:     Effort: Pulmonary effort is normal.     Breath sounds: Normal breath sounds.  Chest:     Chest wall: Tenderness (L upper chest wall) present.  Musculoskeletal:     Cervical back: Neck supple.  Skin:    General: Skin is warm and dry.  Neurological:     General: No focal deficit present.     Mental  Status: He is alert.  Psychiatric:        Mood and Affect: Mood normal.        Behavior: Behavior normal.     ------------------------------------------------------------------------------------------------------------------------------------------------------------------------------------------------------------------- Assessment and Plan  SOB (shortness of breath) I think the discomfort in his chest and associated sensations of shortness of breat are related to muscle strain. Recommend conservative management with rest, icing and anti-inflammatories.  Does not sound cardiac in nature.  I think PE is unlikely as well.  I am ordering a CXR today and encouraged him to remain quit from vaping/nicotine products.     No orders of the defined types were placed in this encounter.   No follow-ups on file.    This visit occurred during the SARS-CoV-2 public health emergency.  Safety protocols were in place, including screening questions prior to the visit, additional usage of staff PPE, and extensive cleaning of exam room while observing appropriate contact time as indicated for disinfecting solutions.

## 2019-11-14 ENCOUNTER — Encounter: Payer: Self-pay | Admitting: Family Medicine

## 2019-11-14 ENCOUNTER — Ambulatory Visit (INDEPENDENT_AMBULATORY_CARE_PROVIDER_SITE_OTHER): Payer: BC Managed Care – PPO | Admitting: Family Medicine

## 2019-11-14 VITALS — BP 139/65 | HR 67 | Temp 98.5°F | Ht 72.84 in | Wt 264.1 lb

## 2019-11-14 DIAGNOSIS — K819 Cholecystitis, unspecified: Secondary | ICD-10-CM | POA: Diagnosis not present

## 2019-11-14 DIAGNOSIS — R7989 Other specified abnormal findings of blood chemistry: Secondary | ICD-10-CM | POA: Diagnosis not present

## 2019-11-14 DIAGNOSIS — R42 Dizziness and giddiness: Secondary | ICD-10-CM | POA: Diagnosis not present

## 2019-11-14 NOTE — Progress Notes (Signed)
Maurice Cole - 24 y.o. male MRN [id number]  Date of birth: 1995-12-20  Subjective Chief Complaint  Patient presents with  . Dizziness  . Altered Mental Status    HPI Maurice Cole is a 24 y.o. male here today with complaint of dizziness.  Reports that he had episode of dizziness earlier today.  This was accompanied by feeling "foggy" afterwards.  He has also had some intermittent feeling that he can't take a deep breath.  He denies associated headache, vision changes, fever, chills, chest pain, palpitations, or exertional dyspnea.   Dizziness does seem to come on with positional change.  He has has some recurrent ear pressure on the R side.  He denies feeling of anxiety contributing to symptoms.  ROS:  A comprehensive ROS was completed and negative except as noted per HPI  Allergies  Allergen Reactions  . Clindamycin Hives  . Penicillins Hives  . Cefaclor     Past Medical History:  Diagnosis Date  . ADHD   . Obesity 10/27/2017    History reviewed. No pertinent surgical history.  Social History   Socioeconomic History  . Marital status: Single    Spouse name: Not on file  . Number of children: Not on file  . Years of education: Not on file  . Highest education level: Not on file  Occupational History  . Occupation: Agricultural consultant  Tobacco Use  . Smoking status: Former Smoker    Types: E-cigarettes    Quit date: 10/24/2019    Years since quitting: 0.0  . Smokeless tobacco: Never Used  Vaping Use  . Vaping Use: Former  . Quit date: 10/24/2019  . Substances: Nicotine-salt  Substance and Sexual Activity  . Alcohol use: No  . Drug use: No  . Sexual activity: Not on file  Other Topics Concern  . Not on file  Social History Narrative  . Not on file   Social Determinants of Health   Financial Resource Strain:   . Difficulty of Paying Living Expenses:   Food Insecurity:   . Worried About Programme researcher, broadcasting/film/video in the Last Year:   . Barista in the Last  Year:   Transportation Needs:   . Freight forwarder (Medical):   Marland Kitchen Lack of Transportation (Non-Medical):   Physical Activity:   . Days of Exercise per Week:   . Minutes of Exercise per Session:   Stress:   . Feeling of Stress :   Social Connections:   . Frequency of Communication with Friends and Family:   . Frequency of Social Gatherings with Friends and Family:   . Attends Religious Services:   . Active Member of Clubs or Organizations:   . Attends Banker Meetings:   Marland Kitchen Marital Status:     Family History  Problem Relation Age of Onset  . Obesity Mother   . COPD Father   . Heart disease Paternal Aunt   . Obesity Paternal Aunt   . Heart disease Paternal Uncle   . Obesity Paternal Uncle   . Cancer Maternal Grandfather 50       Brain  . Cancer Paternal Grandmother 19       Lung Cancer  . COPD Maternal Grandmother   . Other Maternal Grandmother        COVID complications    Health Maintenance  Topic Date Due  . Hepatitis C Screening  Never done  . COVID-19 Vaccine (1) 01/04/2020 (Originally 08/18/2008)  . TETANUS/TDAP  07/18/2020 (  Originally 11/23/2017)  . INFLUENZA VACCINE  12/04/2019  . HIV Screening  Completed     ----------------------------------------------------------------------------------------------------------------------------------------------------------------------------------------------------------------- Physical Exam BP 139/65 (BP Location: Left Arm, Patient Position: Sitting, Cuff Size: Large)   Pulse 67   Temp 98.5 F (36.9 C) (Oral)   Ht 6' 0.84" (1.85 m)   Wt 264 lb 1.9 oz (119.8 kg)   SpO2 98%   BMI 35.00 kg/m   Physical Exam Constitutional:      Appearance: Normal appearance.  HENT:     Head: Normocephalic and atraumatic.  Eyes:     General: No scleral icterus. Cardiovascular:     Rate and Rhythm: Normal rate and regular rhythm.  Pulmonary:     Effort: Pulmonary effort is normal.     Breath sounds: Normal  breath sounds.  Musculoskeletal:     Cervical back: Neck supple.  Neurological:     General: No focal deficit present.     Mental Status: He is alert.  Psychiatric:        Mood and Affect: Mood normal.        Behavior: Behavior normal.     ------------------------------------------------------------------------------------------------------------------------------------------------------------------------------------------------------------------- Assessment and Plan  Dizziness Unclear etiology, symptoms seem more vertiginous in nature. No associated palpitations and denies anxiety.  Previous CXR normal  Recommend meclizine as needed initially.   Will check labs as well today.     No orders of the defined types were placed in this encounter.   No follow-ups on file.    This visit occurred during the SARS-CoV-2 public health emergency.  Safety protocols were in place, including screening questions prior to the visit, additional usage of staff PPE, and extensive cleaning of exam room while observing appropriate contact time as indicated for disinfecting solutions.

## 2019-11-14 NOTE — Assessment & Plan Note (Signed)
Unclear etiology, symptoms seem more vertiginous in nature. No associated palpitations and denies anxiety.  Previous CXR normal  Recommend meclizine as needed initially.   Will check labs as well today.

## 2019-11-14 NOTE — Patient Instructions (Addendum)
Try adding flonase daily Continue xyzal.   You can try meclizine as needed for dizziness if needed. Marland Kitchen

## 2019-11-15 LAB — COMPLETE METABOLIC PANEL WITH GFR
AG Ratio: 1.7 (calc) (ref 1.0–2.5)
ALT: 27 U/L (ref 9–46)
AST: 18 U/L (ref 10–40)
Albumin: 4.8 g/dL (ref 3.6–5.1)
Alkaline phosphatase (APISO): 36 U/L (ref 36–130)
BUN: 12 mg/dL (ref 7–25)
CO2: 27 mmol/L (ref 20–32)
Calcium: 9.8 mg/dL (ref 8.6–10.3)
Chloride: 103 mmol/L (ref 98–110)
Creat: 1 mg/dL (ref 0.60–1.35)
GFR, Est African American: 122 mL/min/{1.73_m2} (ref >=60)
GFR, Est Non African American: 106 mL/min/{1.73_m2} (ref >=60)
Globulin: 2.8 g/dL (calc) (ref 1.9–3.7)
Glucose, Bld: 91 mg/dL (ref 65–99)
Potassium: 4.4 mmol/L (ref 3.5–5.3)
Sodium: 138 mmol/L (ref 135–146)
Total Bilirubin: 0.4 mg/dL (ref 0.2–1.2)
Total Protein: 7.6 g/dL (ref 6.1–8.1)

## 2019-11-15 LAB — URINALYSIS, ROUTINE W REFLEX MICROSCOPIC
Bilirubin Urine: NEGATIVE
Glucose, UA: NEGATIVE
Hgb urine dipstick: NEGATIVE
Ketones, ur: NEGATIVE
Leukocytes,Ua: NEGATIVE
Nitrite: NEGATIVE
Protein, ur: NEGATIVE
Specific Gravity, Urine: 1.016 (ref 1.001–1.03)
pH: 7.5 (ref 5.0–8.0)

## 2019-11-15 LAB — CBC
HCT: 47 % (ref 38.5–50.0)
Hemoglobin: 15.6 g/dL (ref 13.2–17.1)
MCH: 29.4 pg (ref 27.0–33.0)
MCHC: 33.2 g/dL (ref 32.0–36.0)
MCV: 88.7 fL (ref 80.0–100.0)
MPV: 9.8 fL (ref 7.5–12.5)
Platelets: 259 10*3/uL (ref 140–400)
RBC: 5.3 10*6/uL (ref 4.20–5.80)
RDW: 12.6 % (ref 11.0–15.0)
WBC: 6.4 10*3/uL (ref 3.8–10.8)

## 2019-11-15 LAB — TSH: TSH: 3.89 mIU/L (ref 0.40–4.50)

## 2019-11-16 ENCOUNTER — Other Ambulatory Visit: Payer: BC Managed Care – PPO

## 2019-11-21 ENCOUNTER — Other Ambulatory Visit: Payer: Self-pay

## 2019-11-21 ENCOUNTER — Ambulatory Visit (INDEPENDENT_AMBULATORY_CARE_PROVIDER_SITE_OTHER): Payer: BC Managed Care – PPO | Admitting: Family Medicine

## 2019-11-21 VITALS — BP 111/54 | HR 59 | Temp 98.7°F | Ht 72.84 in | Wt 266.0 lb

## 2019-11-21 DIAGNOSIS — R0602 Shortness of breath: Secondary | ICD-10-CM

## 2019-11-21 MED ORDER — ALBUTEROL SULFATE HFA 108 (90 BASE) MCG/ACT IN AERS
2.0000 | INHALATION_SPRAY | Freq: Once | RESPIRATORY_TRACT | Status: AC
  Administered 2019-11-21: 2 via RESPIRATORY_TRACT

## 2019-11-21 NOTE — Progress Notes (Signed)
[name] - 24 y.o. male MRN [id number]  Date of birth: May 24, 1995  Subjective Chief Complaint  Patient presents with  . spirometry    HPI Maurice Cole is a 24 y.o. male here today for follow up of intermittent episodes of shortness of breath.  He completed spirometry today without significant difficulty.  He continues to have occasional episodes of shortness of breath.   ROS:  A comprehensive ROS was completed and negative except as noted per HPI  Allergies  Allergen Reactions  . Clindamycin Hives  . Penicillins Hives  . Cefaclor     Past Medical History:  Diagnosis Date  . ADHD   . Obesity 10/27/2017    No past surgical history on file.  Social History   Socioeconomic History  . Marital status: Single    Spouse name: Not on file  . Number of children: Not on file  . Years of education: Not on file  . Highest education level: Not on file  Occupational History  . Occupation: Agricultural consultant  Tobacco Use  . Smoking status: Former Smoker    Types: E-cigarettes    Quit date: 10/24/2019    Years since quitting: 0.0  . Smokeless tobacco: Never Used  Vaping Use  . Vaping Use: Former  . Quit date: 10/24/2019  . Substances: Nicotine-salt  Substance and Sexual Activity  . Alcohol use: No  . Drug use: No  . Sexual activity: Not on file  Other Topics Concern  . Not on file  Social History Narrative  . Not on file   Social Determinants of Health   Financial Resource Strain:   . Difficulty of Paying Living Expenses:   Food Insecurity:   . Worried About Programme researcher, broadcasting/film/video in the Last Year:   . Barista in the Last Year:   Transportation Needs:   . Freight forwarder (Medical):   Marland Kitchen Lack of Transportation (Non-Medical):   Physical Activity:   . Days of Exercise per Week:   . Minutes of Exercise per Session:   Stress:   . Feeling of Stress :   Social Connections:   . Frequency of Communication with Friends and Family:   . Frequency of Social  Gatherings with Friends and Family:   . Attends Religious Services:   . Active Member of Clubs or Organizations:   . Attends Banker Meetings:   Marland Kitchen Marital Status:     Family History  Problem Relation Age of Onset  . Obesity Mother   . COPD Father   . Heart disease Paternal Aunt   . Obesity Paternal Aunt   . Heart disease Paternal Uncle   . Obesity Paternal Uncle   . Cancer Maternal Grandfather 50       Brain  . Cancer Paternal Grandmother 72       Lung Cancer  . COPD Maternal Grandmother   . Other Maternal Grandmother        COVID complications    Health Maintenance  Topic Date Due  . Hepatitis C Screening  Never done  . COVID-19 Vaccine (1) 01/04/2020 (Originally 08/18/2008)  . TETANUS/TDAP  07/18/2020 (Originally 11/23/2017)  . INFLUENZA VACCINE  12/04/2019  . HIV Screening  Completed     ----------------------------------------------------------------------------------------------------------------------------------------------------------------------------------------------------------------- Physical Exam BP (!) 111/54   Pulse (!) 59   Temp 98.7 F (37.1 C) (Oral)   Ht 6' 0.84" (1.85 m)   Wt 266 lb (120.7 kg)   BMI 35.25 kg/m   Physical  Exam Constitutional:      Appearance: Normal appearance.  Cardiovascular:     Rate and Rhythm: Normal rate and regular rhythm.  Pulmonary:     Effort: Pulmonary effort is normal.     Breath sounds: Normal breath sounds.  Neurological:     General: No focal deficit present.     Mental Status: He is alert.     ------------------------------------------------------------------------------------------------------------------------------------------------------------------------------------------------------------------- Assessment and Plan  SOB (shortness of breath) Normal spirometry today without significant change pre and post bronchodilator.     Meds ordered this encounter  Medications  . albuterol  (VENTOLIN HFA) 108 (90 Base) MCG/ACT inhaler 2 puff    No follow-ups on file.    This visit occurred during the SARS-CoV-2 public health emergency.  Safety protocols were in place, including screening questions prior to the visit, additional usage of staff PPE, and extensive cleaning of exam room while observing appropriate contact time as indicated for disinfecting solutions.

## 2019-11-21 NOTE — Assessment & Plan Note (Signed)
Normal spirometry today without significant change pre and post bronchodilator.

## 2019-11-21 NOTE — Progress Notes (Signed)
Patient in clinic for a breathing test.

## 2019-12-03 DIAGNOSIS — U071 COVID-19: Secondary | ICD-10-CM | POA: Diagnosis not present

## 2021-01-26 IMAGING — DX DG CHEST 2V
2 series · 2 of 2 positions shown · non-contrast
Comparison: None.

CLINICAL DATA: Shortness of breath, left chest wall pain

EXAM:
CHEST - 2 VIEW

[chest pa]
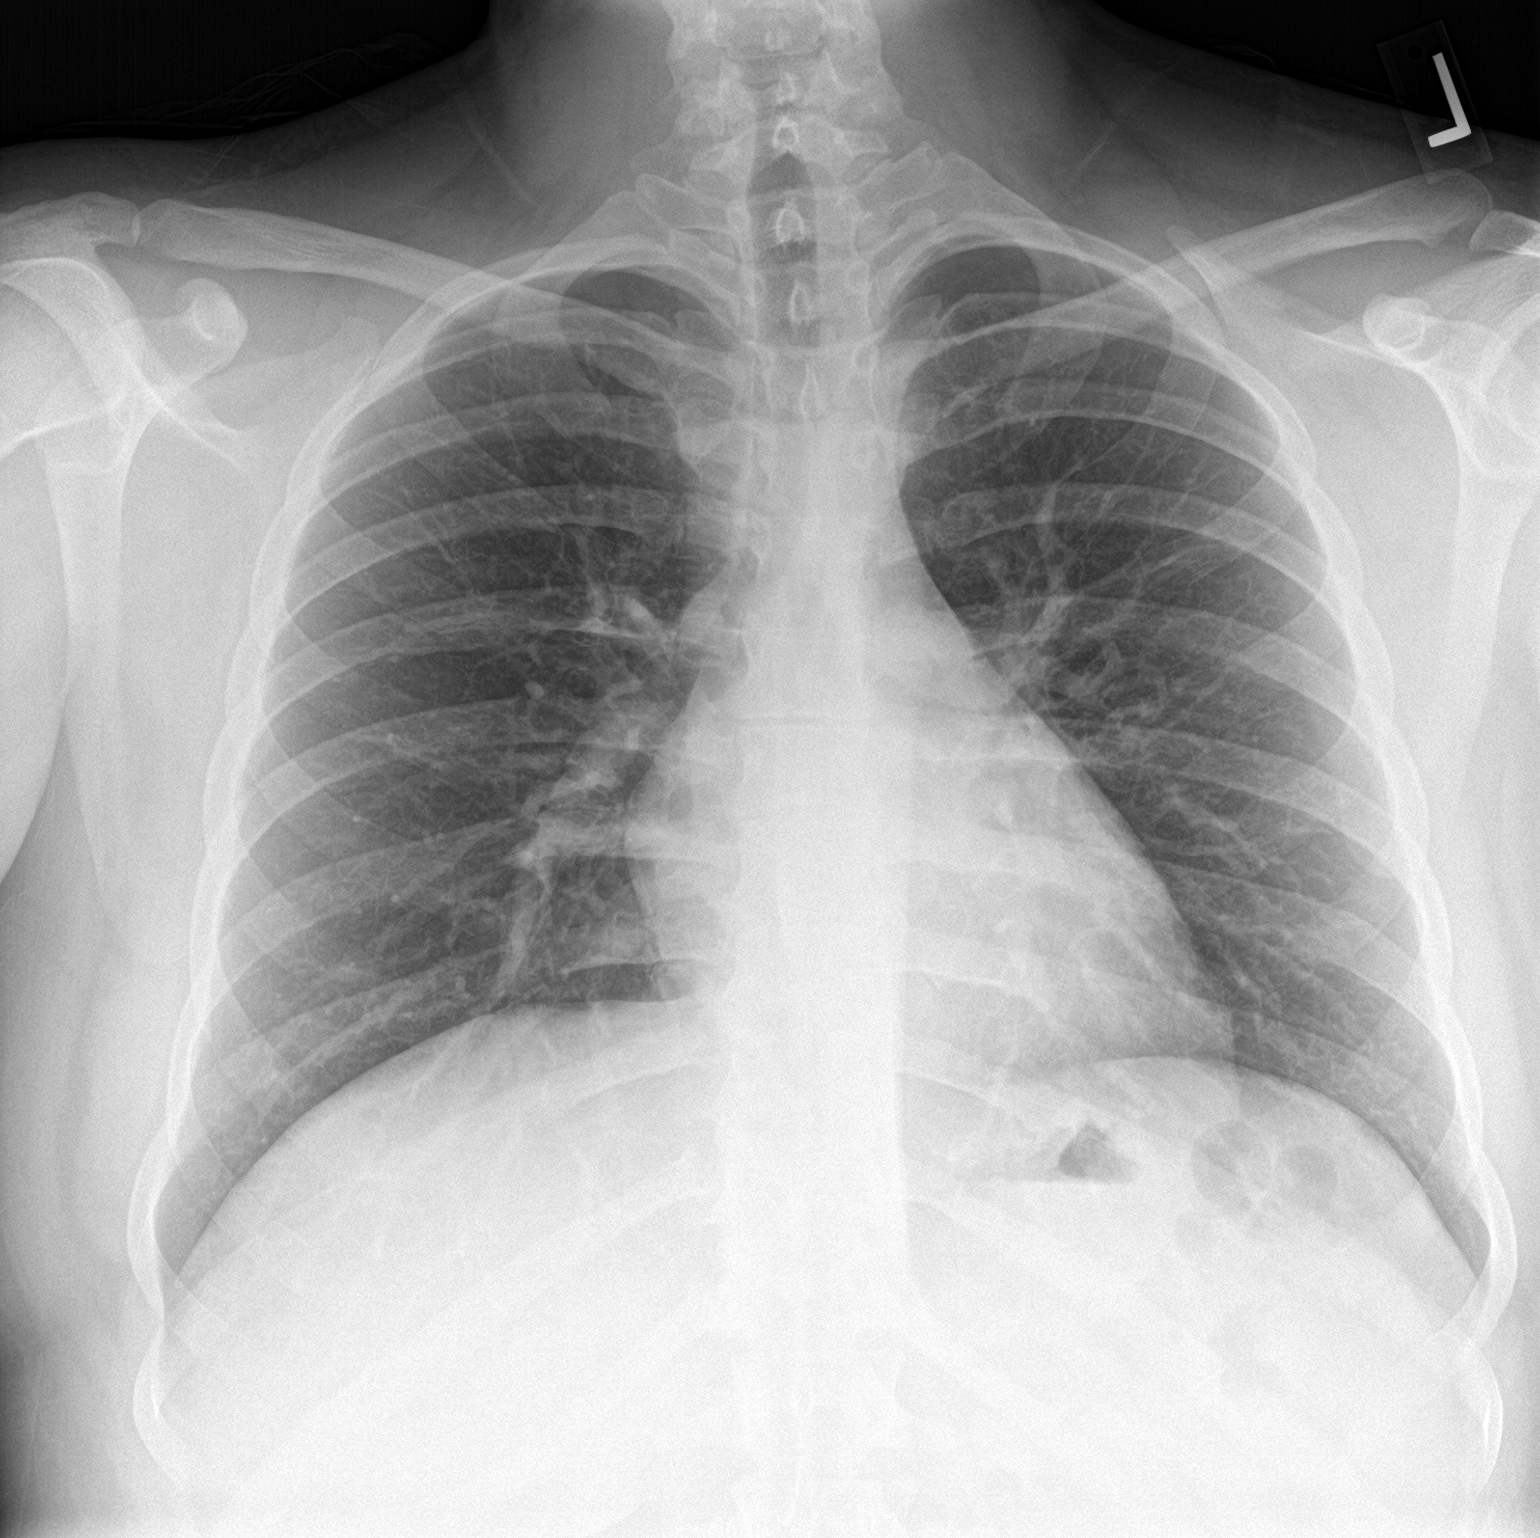

[chest lat]
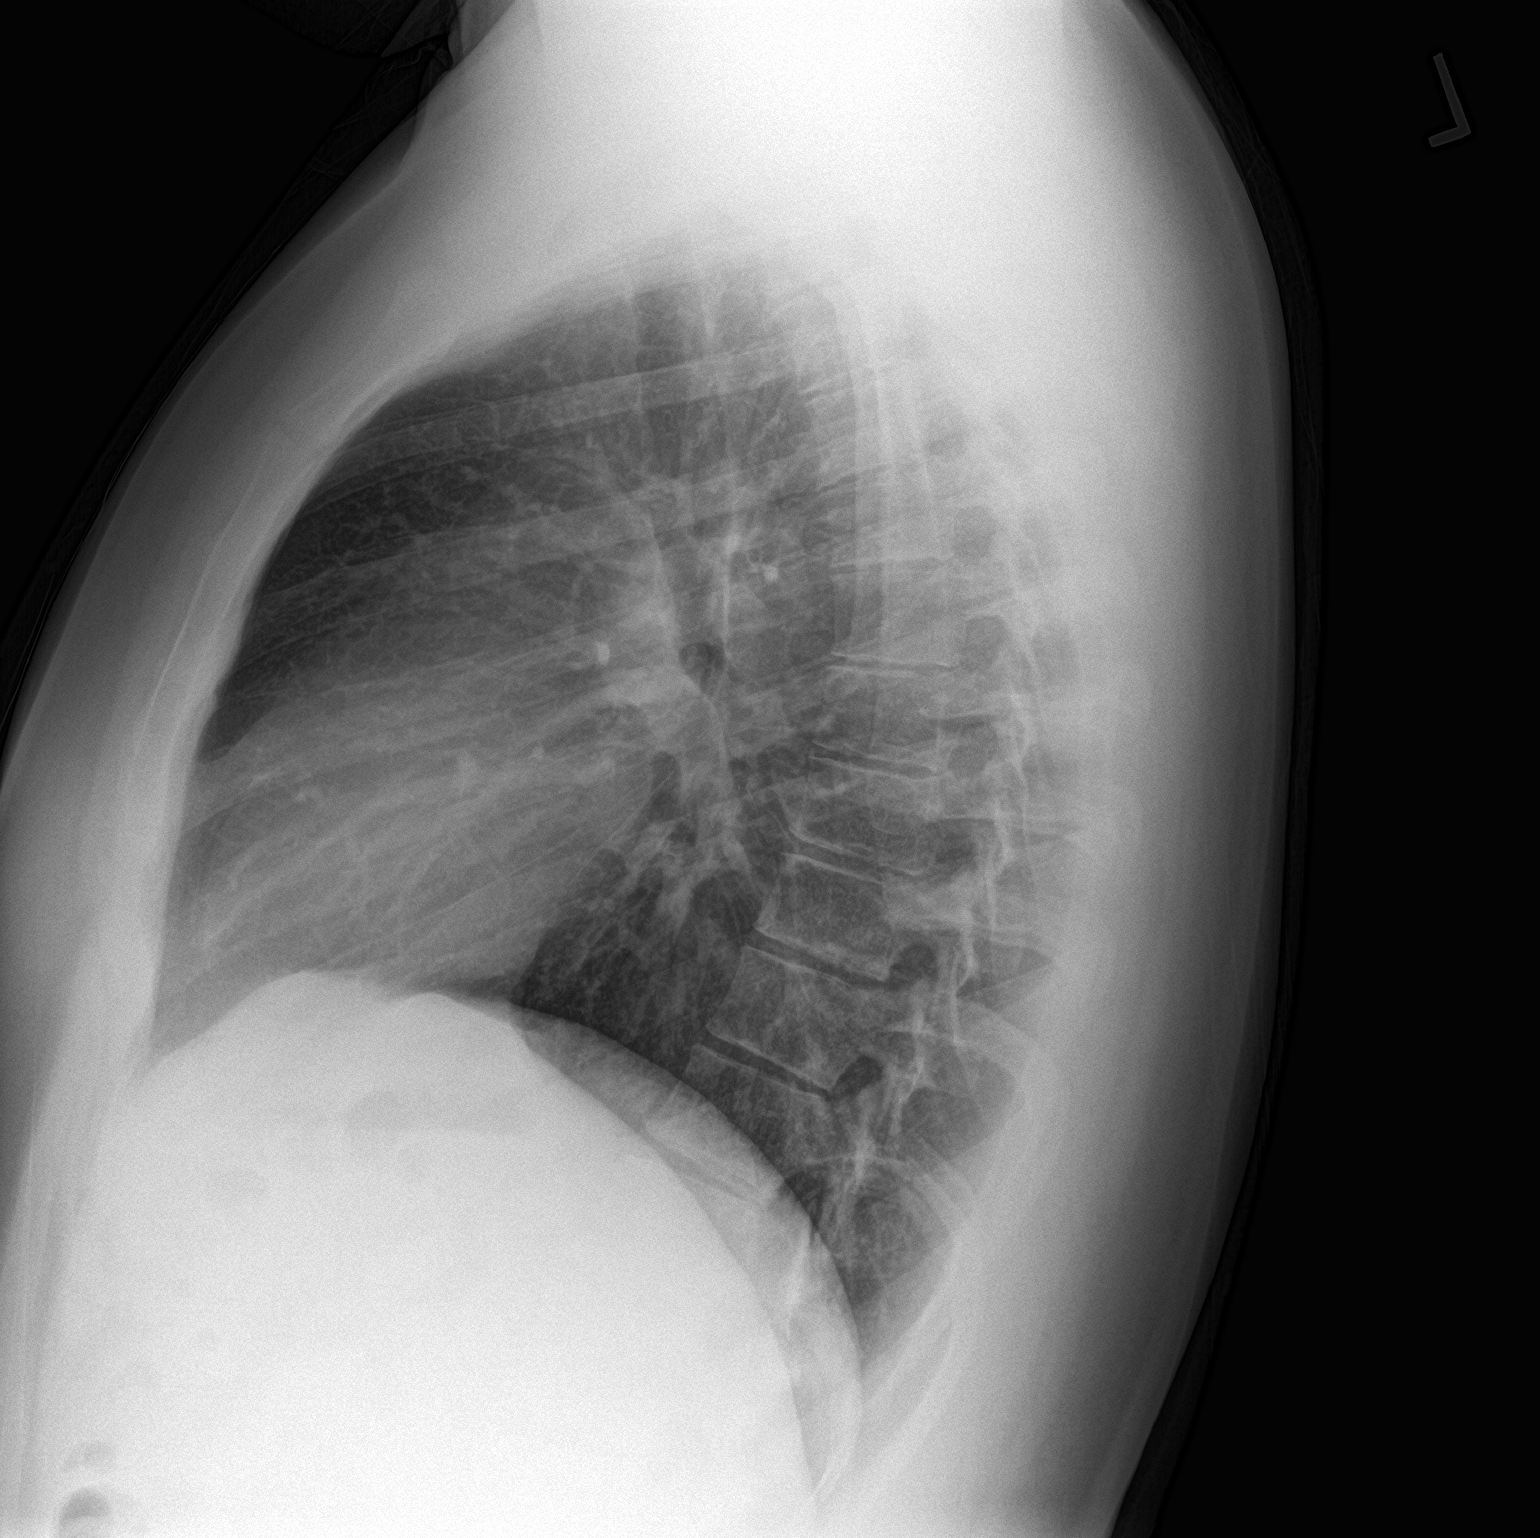

[2 of 2 positions shown; findings below may reference images not displayed]

FINDINGS: The heart size and mediastinal contours are within normal limits.
Both lungs are clear. The visualized skeletal structures are
unremarkable.
IMPRESSION: No active cardiopulmonary disease.

## 2021-08-23 ENCOUNTER — Emergency Department (INDEPENDENT_AMBULATORY_CARE_PROVIDER_SITE_OTHER)
Admission: EM | Admit: 2021-08-23 | Discharge: 2021-08-23 | Disposition: A | Discharge status: Home / Self Care | Payer: BC Managed Care – PPO | Source: Home / Self Care

## 2021-08-23 ENCOUNTER — Emergency Department (INDEPENDENT_AMBULATORY_CARE_PROVIDER_SITE_OTHER): Payer: BC Managed Care – PPO

## 2021-08-23 DIAGNOSIS — M79675 Pain in left toe(s): Secondary | ICD-10-CM

## 2021-08-23 DIAGNOSIS — S9032XA Contusion of left foot, initial encounter: Secondary | ICD-10-CM

## 2021-08-23 DIAGNOSIS — S99922A Unspecified injury of left foot, initial encounter: Secondary | ICD-10-CM | POA: Diagnosis not present

## 2021-08-23 NOTE — ED Triage Notes (Signed)
Pt here today for LT foot injury. Says he dropped a coffee table on it at 8am this am. Pain 7/10 when walking ?

## 2021-08-23 NOTE — ED Provider Notes (Signed)
?[facility] ? ? ? ?CSN: [id number] ?Arrival date & time: 08/23/21  1501 ? ? ?  ? ?History   ?Chief Complaint ?Chief Complaint  ?Patient presents with  ? Foot Injury  ?  LT  ? ? ?HPI ?Elma Vandiest is a 26 y.o. male.  ? ?HPI 26 year old male presents with left foot injury for 1 day.  Patient reports dropped a coffee table onto today a.m. this morning pain 7 of 10 when walking.  PMH significant for obesity, elevated BP and ADHD. ? ?Past Medical History:  ?Diagnosis Date  ? ADHD   ? Obesity 10/27/2017  ? ? ?Patient Active Problem List  ? Diagnosis Date Noted  ? Dizziness 11/14/2019  ? SOB (shortness of breath) 10/27/2019  ? Cholecystitis 07/28/2019  ? Epigastric pain 07/20/2019  ? Obesity 10/27/2017  ? Elevated TSH 03/06/2015  ? Lumbago 12/19/2014  ? Elevated blood pressure 12/19/2014  ? ADHD (attention deficit hyperactivity disorder) 02/14/2013  ? ? ?History reviewed. No pertinent surgical history. ? ? ? ? ?Home Medications   ? ?Prior to Admission medications   ?Medication Sig Start Date End Date Taking? Authorizing Provider  ?levocetirizine (XYZAL) 5 MG tablet Take 5 mg by mouth at bedtime. 10/04/19   [provider]  ? ? ?Family History ?Family History  ?Problem Relation Age of Onset  ? Obesity Mother   ? COPD Father   ? Heart disease Paternal Aunt   ? Obesity Paternal Aunt   ? Heart disease Paternal Uncle   ? Obesity Paternal Uncle   ? Cancer Maternal Grandfather 50  ?     Brain  ? Cancer Paternal Grandmother 31  ?     Lung Cancer  ? COPD Maternal Grandmother   ? Other Maternal Grandmother   ?     COVID complications  ? ? ?Social History ?Social History  ? ?Tobacco Use  ? Smoking status: Former  ?  Types: E-cigarettes  ?  Quit date: 10/24/2019  ?  Years since quitting: 1.8  ? Smokeless tobacco: Never  ?Vaping Use  ? Vaping Use: Former  ? Quit date: 10/24/2019  ? Substances: Nicotine-salt  ?Substance Use Topics  ? Alcohol use: No  ? Drug use: No  ? ? ? ?Allergies   ?Clindamycin, Penicillins, and  Cefaclor ? ? ?Review of Systems ?Review of Systems  ?Musculoskeletal:   ?     Left foot injury x 1 day  ? ? ?Physical Exam ?Triage Vital Signs ?ED Triage Vitals  ?Enc Vitals Group  ?   BP   ?   Pulse   ?   Resp   ?   Temp   ?   Temp src   ?   SpO2   ?   Weight   ?   Height   ?   Head Circumference   ?   Peak Flow   ?   Pain Score   ?   Pain Loc   ?   Pain Edu?   ?   Excl. in [place]?   ? ?No data found. ? ?Updated Vital Signs ?BP (!) 137/91 (BP Location: Right Arm)   Pulse 98   Temp 97.7 ?F (36.5 ?C) (Oral)   Resp 18   SpO2 99%  ? ? ?Physical Exam ?Vitals and nursing note reviewed.  ?Constitutional:   ?   General: He is not in acute distress. ?   Appearance: Normal appearance. He is obese. He is not ill-appearing.  ?HENT:  ?  Head: Normocephalic and atraumatic.  ?   Mouth/Throat:  ?   Mouth: Mucous membranes are moist.  ?   Pharynx: Oropharynx is clear.  ?Eyes:  ?   Extraocular Movements: Extraocular movements intact.  ?   Conjunctiva/sclera: Conjunctivae normal.  ?   Pupils: Pupils are equal, round, and reactive to light.  ?Cardiovascular:  ?   Rate and Rhythm: Normal rate and regular rhythm.  ?   Pulses: Normal pulses.  ?   Heart sounds: Normal heart sounds.  ?Pulmonary:  ?   Effort: Pulmonary effort is normal.  ?   Breath sounds: Normal breath sounds. No wheezing, rhonchi or rales.  ?Chest:  ?   Chest wall: No tenderness.  ?Musculoskeletal:  ?   Cervical back: Normal range of motion and neck supple.  ?   Comments: Left foot (dorsum): TTP over distal first/second metatarsals with minimal soft tissue swelling noted  ?Skin: ?   General: Skin is warm and dry.  ?Neurological:  ?   General: No focal deficit present.  ?   Mental Status: He is alert and oriented to person, place, and time.  ? ? ? ?UC Treatments / Results  ?Labs ?(all labs ordered are listed, but only abnormal results are displayed) ?Labs Reviewed - No data to display ? ?EKG ? ? ?Radiology ?DG Foot Complete Left ? ?Result Date: 08/23/2021 ?CLINICAL DATA:   Left foot injury. Dropped a coffee table on foot. Great toe pain medially. EXAM: LEFT FOOT - COMPLETE 3+ VIEW COMPARISON:  None. FINDINGS: Normal bone mineralization. Joint spaces are preserved. No acute fracture is seen. No dislocation. IMPRESSION: No acute fracture. Electronically Signed   By: Yvonne Kendall M.D.   On: 08/23/2021 15:42   ? ?Procedures ?Procedures (including critical care time) ? ?Medications Ordered in UC ?Medications - No data to display ? ?Initial Impression / Assessment and Plan / UC Course  ?I have reviewed the triage vital signs and the nursing notes. ? ?Pertinent labs & imaging results that were available during my care of the patient were reviewed by me and considered in my medical decision making (see chart for details). ? ?  ? ?MDM: 1.  Left foot contusion-left foot x-ray revealed no acute osseous abnormality.  Advised/informed patient of left foot x-ray results, with hard copy provided to patient.  Advised patient may RICE right left foot 25 minutes 2-3 times daily for the next 3 days.  Advised may use OTC Ibuprofen 800 mg 1-2 times daily, as needed for left foot pain.  Advised patient if left foot pain worsens and/or unresolved please follow-up with [facility]opedic provider above for further evaluation.  Contact information for this provider has been included with this AVS. work note provided to patient per request.  Patient discharged home, hemodynamically stable. ?Final Clinical Impressions(s) / UC Diagnoses  ? ?Final diagnoses:  ?Contusion of left foot, initial encounter  ? ? ? ?Discharge Instructions   ? ?  ?Advised/informed patient of left foot x-ray results, with hard copy provided to patient.  Advised patient may RICE right left foot 25 minutes 2-3 times daily for the next 3 days.  Advised may use OTC Ibuprofen 800 mg 1-2 times daily, as needed for left foot pain.  Advised patient if left foot pain worsens and/or unresolved please follow-up with [facility] health orthopedic  provider above for further evaluation.  Contact information for this provider has been included with this AVS. ? ? ? ? ?ED Prescriptions   ?None ?  ? ?  PDMP not reviewed this encounter. ?  ?Eliezer Lofts, [place] ?08/23/21 1618 ? ?

## 2021-08-23 NOTE — Discharge Instructions (Addendum)
Advised/informed patient of left foot x-ray results, with hard copy provided to patient.  Advised patient may RICE right left foot 25 minutes 2-3 times daily for the next 3 days.  Advised may use OTC Ibuprofen 800 mg 1-2 times daily, as needed for left foot pain.  Advised patient if left foot pain worsens and/or unresolved please follow-up with [facility] health orthopedic provider above for further evaluation.  Contact information for this provider has been included with this AVS. ?

## 2022-11-17 ENCOUNTER — Ambulatory Visit (INDEPENDENT_AMBULATORY_CARE_PROVIDER_SITE_OTHER): Payer: 59 | Admitting: Family Medicine

## 2022-11-17 ENCOUNTER — Encounter: Payer: Self-pay | Admitting: Family Medicine

## 2022-11-17 ENCOUNTER — Telehealth: Payer: Self-pay | Admitting: Family Medicine

## 2022-11-17 VITALS — BP 116/57 | HR 68 | Ht 72.84 in | Wt 247.0 lb

## 2022-11-17 DIAGNOSIS — F909 Attention-deficit hyperactivity disorder, unspecified type: Secondary | ICD-10-CM

## 2022-11-17 MED ORDER — LISDEXAMFETAMINE DIMESYLATE 30 MG PO CAPS: 30.0000 mg | ORAL_CAPSULE | Freq: Every day | ORAL | 0 refills | Status: DC

## 2022-11-17 NOTE — Progress Notes (Signed)
Maurice Cole - 27 y.o. male MRN [id number]  Date of birth: [birth year]  Subjective Chief Complaint  Patient presents with   ADHD    HPI Maurice Cole is a 27 y.o. male here today to discuss restarting ADD medication. He has not been seen in nearly 3 years.  He has history of ADHD.  Last treated in [employer].  He was previously treated with Concerta.  He is restarting school to get a degree in business management.  He stopped the Concerta after high school because he didn't really like how it made him feel.  He has never tried anything else.  He has noted difficulty with attention span at work.   ROS:  A comprehensive ROS was completed and negative except as noted per HPI  Allergies  Allergen Reactions   Clindamycin Hives   Penicillins Hives   Cefaclor     Past Medical History:  Diagnosis Date   ADHD    Obesity 10/27/2017    History reviewed. No pertinent surgical history.  Social History   Socioeconomic History   Marital status: Single    Spouse name: Not on file   Number of children: Not on file   Years of education: Not on file   Highest education level: Not on file  Occupational History   Occupation: Professional Gamer  Tobacco Use   Smoking status: Former    Types: E-cigarettes    Quit date: 10/24/2019    Years since quitting: 3.0   Smokeless tobacco: Never  Vaping Use   Vaping status: Former   Quit date: 10/24/2019   Substances: Nicotine-salt  Substance and Sexual Activity   Alcohol use: No   Drug use: No   Sexual activity: Not on file  Other Topics Concern   Not on file  Social History Narrative   Not on file   Social Determinants of Health   Financial Resource Strain: Not on file  Food Insecurity: Not on file  Transportation Needs: Not on file  Physical Activity: Sufficiently Active (10/27/2017)   Exercise Vital Sign    Days of Exercise per Week: 3 days    Minutes of Exercise per Session: 90 min  Stress: Not on file  Social Connections: Not on  file    Family History  Problem Relation Age of Onset   Obesity Mother    COPD Father    Heart disease Paternal Aunt    Obesity Paternal Aunt    Heart disease Paternal Uncle    Obesity Paternal Uncle    Cancer Maternal Grandfather 58       Brain   Cancer Paternal Grandmother 70       Lung Cancer   COPD Maternal Grandmother    Other Maternal Grandmother        COVID complications    Health Maintenance  Topic Date Due   DTaP/Tdap/Td (7 - Td or Tdap) 11/23/2017   COVID-19 Vaccine (1 - 2023-24 season) 12/03/2022 (Originally 01/03/2022)   HPV VACCINES (1 - Male 3-dose series) 11/17/2023 (Originally 08/19/2011)   Hepatitis C Screening  11/17/2023 (Originally 08/19/2014)   INFLUENZA VACCINE  12/04/2022   HIV Screening  Completed     ----------------------------------------------------------------------------------------------------------------------------------------------------------------------------------------------------------------- Physical Exam BP (!) 116/57 (BP Location: Left Arm, Patient Position: Sitting, Cuff Size: Large)   Pulse 68   Ht 6' 0.84" (1.85 m)   Wt 247 lb (112 kg)   SpO2 100%   BMI 32.73 kg/m   Physical Exam Constitutional:      Appearance: Normal  appearance.  Cardiovascular:     Rate and Rhythm: Normal rate and regular rhythm.  Pulmonary:     Effort: Pulmonary effort is normal.     Breath sounds: Normal breath sounds.  Neurological:     Mental Status: He is alert.  Psychiatric:        Mood and Affect: Mood normal.        Behavior: Behavior normal.     ------------------------------------------------------------------------------------------------------------------------------------------------------------------------------------------------------------------- Assessment and Plan  ADHD (attention deficit hyperactivity disorder) He would like to restart medication for ADHD.  Adding Vyvanse 30mg  daily. PDMP reviewed.  Return in 4-6 weeks.     Meds ordered this encounter  Medications   lisdexamfetamine (VYVANSE) 30 MG capsule    Sig: Take 1 capsule (30 mg total) by mouth daily.    Dispense:  30 capsule    Refill:  0    Return in about 4 weeks (around 12/15/2022) for F/u ADD.    This visit occurred during the SARS-CoV-2 public health emergency.  Safety protocols were in place, including screening questions prior to the visit, additional usage of staff PPE, and extensive cleaning of exam room while observing appropriate contact time as indicated for disinfecting solutions.

## 2022-11-17 NOTE — Assessment & Plan Note (Signed)
He would like to restart medication for ADHD.  Adding Vyvanse 30mg  daily. PDMP reviewed.  Return in 4-6 weeks.

## 2022-11-17 NOTE — Telephone Encounter (Signed)
Have him use the 11 that he has and if the strength is working well for him we'll plan to continue and I'll send over a prescription to another pharmacy.  If the 30mg  doesn't work for him we can increase if needed.   Thanks!  CM

## 2022-11-17 NOTE — Telephone Encounter (Signed)
Patient called he stated his pharmacy doesn't have enough of Vyvanse 30mg  he is requesting to resubmit to [facility] in [place] [phone]

## 2022-11-17 NOTE — Telephone Encounter (Signed)
[facility] [place] [facility] [address] states he did fill the prescription for 11 tablets. They told him to try it and see if it works for him. They did say they have enough 50 mg.   The [facility] [place] does not [facility] the 30 mg tablets.

## 2022-11-18 NOTE — Telephone Encounter (Signed)
 Patient advised.

## 2022-11-23 IMAGING — DX DG FOOT COMPLETE 3+V*L*
3 series · 3 of 3 positions shown · non-contrast
Comparison: None.

CLINICAL DATA: Left foot injury. Dropped a coffee table on foot.
Great toe pain medially.

EXAM:
LEFT FOOT - COMPLETE 3+ VIEW

[foot ap]
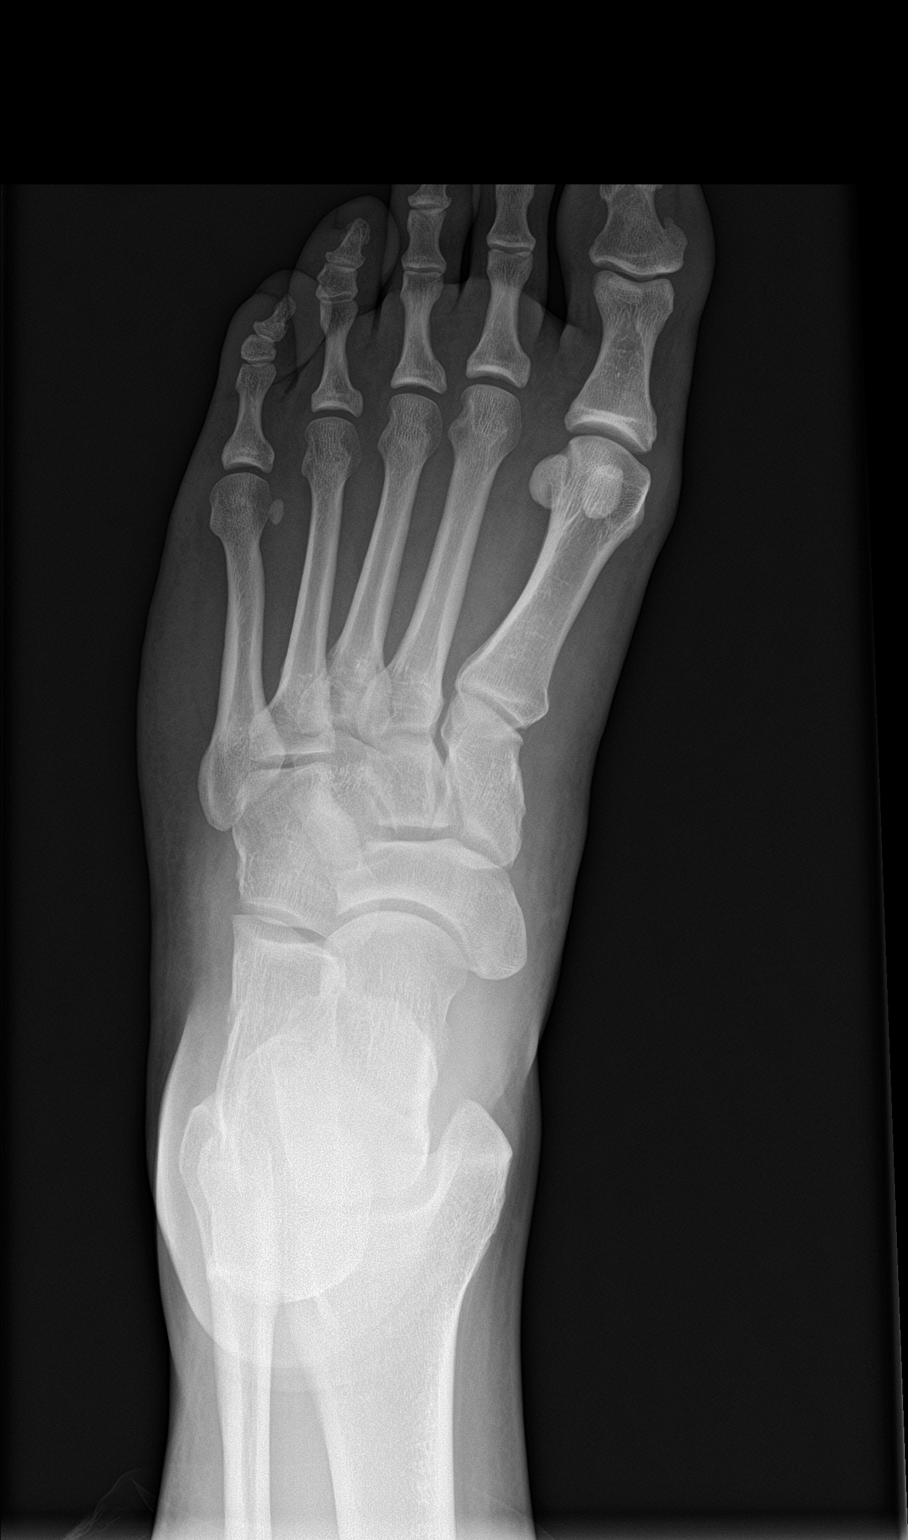

[foot obl]
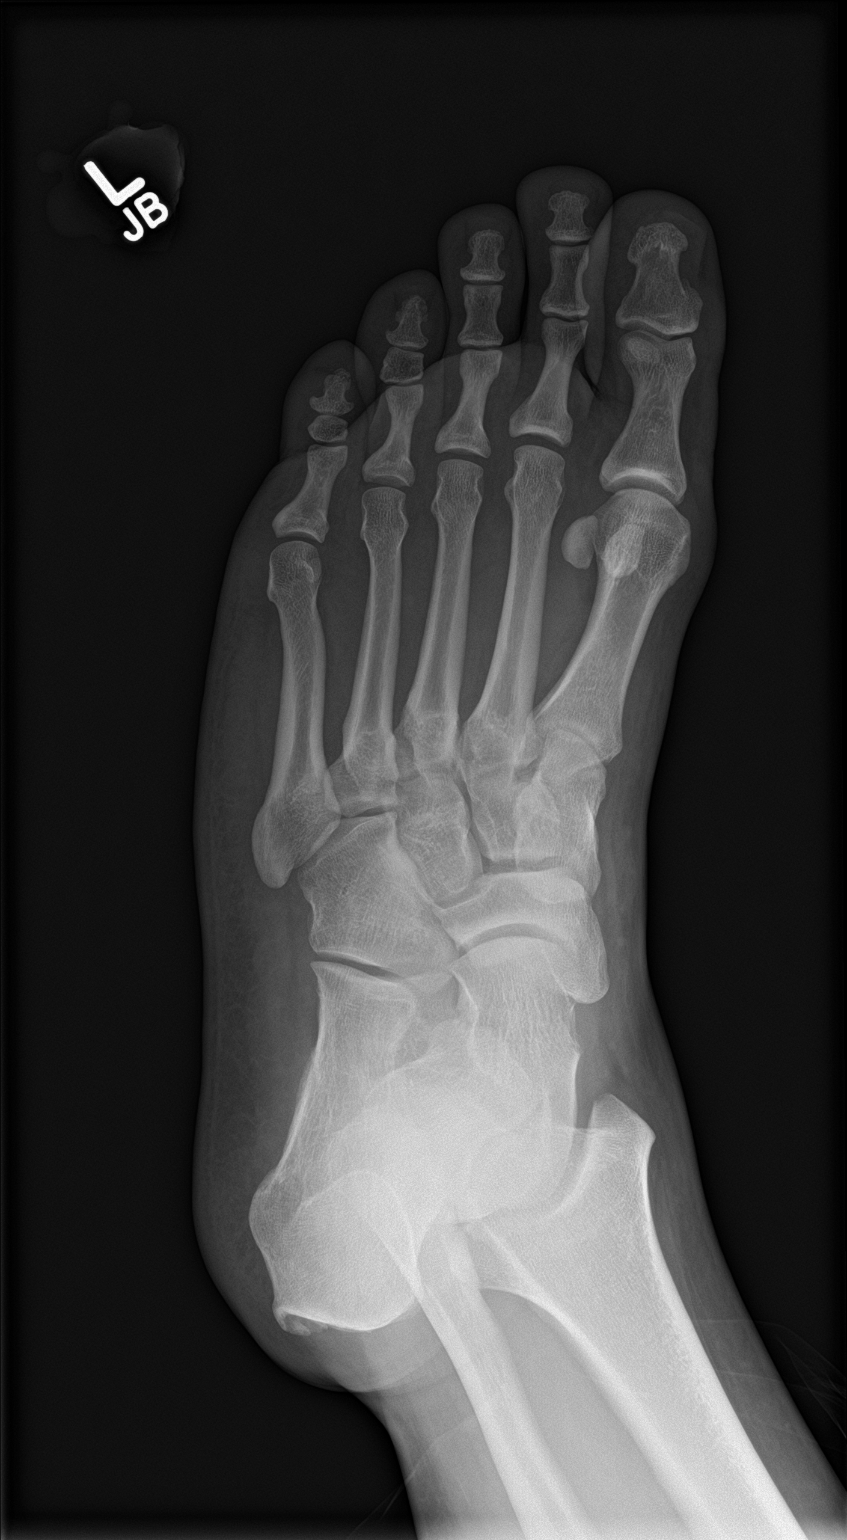

[foot lat]
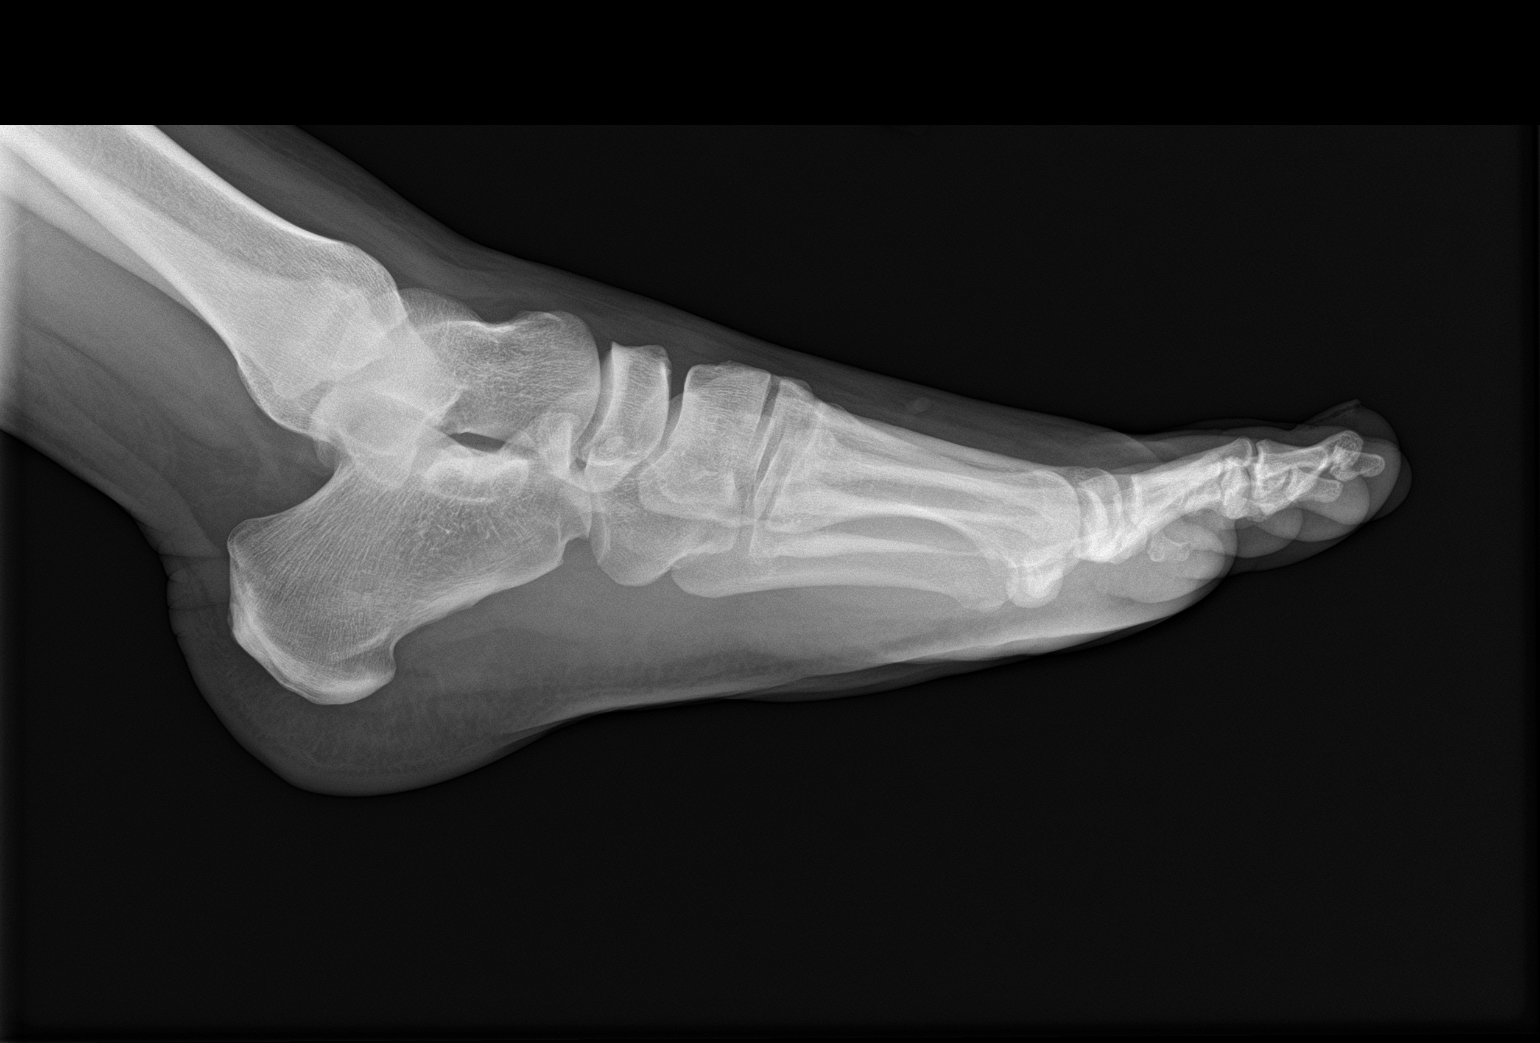

[3 of 3 positions shown; findings below may reference images not displayed]

FINDINGS: Normal bone mineralization. Joint spaces are preserved. No acute
fracture is seen. No dislocation.
IMPRESSION: No acute fracture.

## 2022-12-01 ENCOUNTER — Telehealth: Payer: Self-pay | Admitting: Family Medicine

## 2022-12-01 NOTE — Telephone Encounter (Signed)
Patient called back about his trial run with Vyvanse. He is requesting a call to discuss how the medication was working. States that the medication works fine, but the side affects are irritating. Wanted to know if there is another medication.

## 2022-12-02 ENCOUNTER — Other Ambulatory Visit: Payer: Self-pay | Admitting: Family Medicine

## 2022-12-03 MED ORDER — LISDEXAMFETAMINE DIMESYLATE 30 MG PO CAPS: 30.0000 mg | ORAL_CAPSULE | Freq: Every day | ORAL | 0 refills | Status: DC

## 2022-12-09 ENCOUNTER — Encounter: Payer: Self-pay | Admitting: Family Medicine

## 2022-12-09 ENCOUNTER — Telehealth (INDEPENDENT_AMBULATORY_CARE_PROVIDER_SITE_OTHER): Payer: 59 | Admitting: Family Medicine

## 2022-12-09 VITALS — Ht 72.84 in | Wt 240.0 lb

## 2022-12-09 DIAGNOSIS — F909 Attention-deficit hyperactivity disorder, unspecified type: Secondary | ICD-10-CM | POA: Diagnosis not present

## 2022-12-09 MED ORDER — LISDEXAMFETAMINE DIMESYLATE 30 MG PO CAPS: 30.0000 mg | ORAL_CAPSULE | Freq: Every day | ORAL | 0 refills | Status: DC

## 2022-12-09 NOTE — Assessment & Plan Note (Signed)
Some ED symptoms with Vyvanse.  Not significant enough that he wants to discontinue.  Will plan to continue with 30mg  daily.

## 2022-12-09 NOTE — Progress Notes (Signed)
Maurice Cole - 27 y.o. male MRN [id number]  Date of birth: 02-Nov-1995   This visit type was conducted due to national recommendations for restrictions regarding the COVID-19 Pandemic (e.g. social distancing).  This format is felt to be most appropriate for this patient at this time.  All issues noted in this document were discussed and addressed.  No physical exam was performed (except for noted visual exam findings with Video Visits).  I discussed the limitations of evaluation and management by telemedicine and the availability of in person appointments. The patient expressed understanding and agreed to proceed.  I connected withNAME@ on 12/09/22 at 11:30 AM EDT by a video enabled telemedicine application and verified that I am speaking with the correct person using two identifiers.  Present at visit: Maurice Coombe, DO [facility] Weible   Patient Location: Home [address] [place] [address]   Provider location:   [facility]  Chief Complaint  Patient presents with   Medication Refill    HPI  Maurice Cole is a 27 y.o. male who presents via audio/video conferencing for a telehealth visit today.   Recently started on Vyvanse for ADHD.  This medication seems to work well for him at current strength however he has had ED side effects since starting this.  This improves if he skips a day or two.  Somewhat bothersome but no to the point that he would like to make any changes at this time.   ROS:  A comprehensive ROS was completed and negative except as noted per HPI  Past Medical History:  Diagnosis Date   ADHD    Obesity 10/27/2017    History reviewed. No pertinent surgical history.  Family History  Problem Relation Age of Onset   Obesity Mother    COPD Father    Heart disease Paternal Aunt    Obesity Paternal Aunt    Heart disease Paternal Uncle    Obesity Paternal Uncle    Cancer Maternal Grandfather 43       Brain   Cancer Paternal Grandmother 45       Lung Cancer    COPD Maternal Grandmother    Other Maternal Grandmother        COVID complications    Social History   Socioeconomic History   Marital status: Single    Spouse name: Not on file   Number of children: Not on file   Years of education: Not on file   Highest education level: Not on file  Occupational History   Occupation: Professional Gamer  Tobacco Use   Smoking status: Former    Types: E-cigarettes    Quit date: 10/24/2019    Years since quitting: 3.1   Smokeless tobacco: Never  Vaping Use   Vaping status: Former   Quit date: 10/24/2019   Substances: Nicotine-salt  Substance and Sexual Activity   Alcohol use: No   Drug use: No   Sexual activity: Not on file  Other Topics Concern   Not on file  Social History Narrative   Not on file   Social Determinants of Health   Financial Resource Strain: Not on file  Food Insecurity: Not on file  Transportation Needs: Not on file  Physical Activity: Sufficiently Active (10/27/2017)   Exercise Vital Sign    Days of Exercise per Week: 3 days    Minutes of Exercise per Session: 90 min  Stress: Not on file  Social Connections: Not on file  Intimate Partner Violence: Not on file  Current Outpatient Medications:    lisdexamfetamine (VYVANSE) 30 MG capsule, Take 1 capsule (30 mg total) by mouth daily., Disp: 30 capsule, Rfl: 0  EXAM:  VITALS per patient if applicable: Ht 6' 0.84" (1.85 m)   Wt 240 lb (108.9 kg)   BMI 31.80 kg/m   GENERAL: alert, oriented, appears well and in no acute distress  HEENT: atraumatic, conjunttiva clear, no obvious abnormalities on inspection of external nose and ears  NECK: normal movements of the head and neck  LUNGS: on inspection no signs of respiratory distress, breathing rate appears normal, no obvious gross SOB, gasping or wheezing  CV: no obvious cyanosis  MS: moves all visible extremities without noticeable abnormality  PSYCH/NEURO: pleasant and cooperative, no obvious depression  or anxiety, speech and thought processing grossly intact  ASSESSMENT AND PLAN:  Discussed the following assessment and plan:  ADHD (attention deficit hyperactivity disorder) Some ED symptoms with Vyvanse.  Not significant enough that he wants to discontinue.  Will plan to continue with 30mg  daily.       I discussed the assessment and treatment plan with the patient. The patient was provided an opportunity to ask questions and all were answered. The patient agreed with the plan and demonstrated an understanding of the instructions.   The patient was advised to call back or seek an in-person evaluation if the symptoms worsen or if the condition fails to improve as anticipated.    Maurice Coombe, DO

## 2022-12-10 ENCOUNTER — Telehealth: Payer: Self-pay

## 2022-12-10 ENCOUNTER — Telehealth: Payer: Self-pay | Admitting: Family Medicine

## 2022-12-10 NOTE — Telephone Encounter (Signed)
Initiated Prior authorization [name] Dimesylate 30MG  capsules Via: Covermymeds Case/Key:[name] Status: approved  as of 12/10/22 Reason:Drug is covered by current benefit plan. No further PA activity needed Called pharmacy to follow up Co-pay 177.84 for generic with auth  Notified Pt via: Mychart   Initiated Prior authorization [name] 30MG  capsules Via: Covermymeds Case/Key:[name] Status: approved  as of 12/10/22 Reason:Authorization Expiration Date: 12/09/2023 brand $373.00 with auth  Notified Pt via: Mychart

## 2022-12-10 NOTE — Telephone Encounter (Signed)
Patient called he is requiring an PA for Vyvanse 30mg  please advise Pharmacy is [facility] [phone]

## 2022-12-12 MED ORDER — AMPHETAMINE-DEXTROAMPHET ER 15 MG PO CP24: 15.0000 mg | ORAL_CAPSULE | ORAL | 0 refills | Status: DC

## 2022-12-12 NOTE — Addendum Note (Signed)
Addended by: Mammie Lorenzo on: 12/12/2022 12:29 PM   Modules accepted: Orders

## 2022-12-12 NOTE — Telephone Encounter (Signed)
Adderall xr sent in to replace vyvanse.   CM

## 2022-12-15 ENCOUNTER — Ambulatory Visit: Payer: Self-pay | Admitting: Family Medicine

## 2023-03-04 ENCOUNTER — Encounter: Payer: Self-pay | Admitting: Family Medicine

## 2023-03-04 ENCOUNTER — Telehealth (INDEPENDENT_AMBULATORY_CARE_PROVIDER_SITE_OTHER): Payer: 59 | Admitting: Family Medicine

## 2023-03-04 VITALS — Ht 72.84 in | Wt 235.0 lb

## 2023-03-04 DIAGNOSIS — F909 Attention-deficit hyperactivity disorder, unspecified type: Secondary | ICD-10-CM

## 2023-03-04 MED ORDER — AMPHETAMINE-DEXTROAMPHET ER 15 MG PO CP24: 15.0000 mg | ORAL_CAPSULE | ORAL | 0 refills | Status: DC

## 2023-03-04 NOTE — Assessment & Plan Note (Addendum)
Doing well with adderall xr 15mg .  Will plan to continue at current strength. Follow up in 6 months.

## 2023-03-04 NOTE — Progress Notes (Signed)
Maurice Cole - 27 y.o. male MRN [id number]  Date of birth: 1995/11/05   This visit type was conducted due to national recommendations for restrictions regarding the COVID-19 Pandemic (e.g. social distancing).  This format is felt to be most appropriate for this patient at this time.  All issues noted in this document were discussed and addressed.  No physical exam was performed (except for noted visual exam findings with Video Visits).  I discussed the limitations of evaluation and management by telemedicine and the availability of in person appointments. The patient expressed understanding and agreed to proceed.  I connected withNAME@ on 03/04/23 at 10:30 AM EDT by a video enabled telemedicine application and verified that I am speaking with the correct person using two identifiers.  Present at visit: Maurice Coombe, DO Marquese Bowden   Patient Location: Home [address] [place] [address]   Provider location:   [facility]  Chief Complaint  Patient presents with   Medical Management of Chronic Issues    HPI  [name] is a 27 y.o. male who presents via audio/video conferencing for a telehealth visit today.  Following up for ADHD.  Reports that he is doing quite well with this at current strength.  He has not had any noticeable side effects.  He doesn't have the insomnia that he was experiencing with Vyvanse.   ROS:  A comprehensive ROS was completed and negative except as noted per HPI  Past Medical History:  Diagnosis Date   ADHD    Obesity 10/27/2017    History reviewed. No pertinent surgical history.  Family History  Problem Relation Age of Onset   Obesity Mother    COPD Father    Heart disease Paternal Aunt    Obesity Paternal Aunt    Heart disease Paternal Uncle    Obesity Paternal Uncle    Cancer Maternal Grandfather 32       Brain   Cancer Paternal Grandmother 44       Lung Cancer   COPD Maternal Grandmother    Other Maternal Grandmother        COVID  complications    Social History   Socioeconomic History   Marital status: Single    Spouse name: Not on file   Number of children: Not on file   Years of education: Not on file   Highest education level: Not on file  Occupational History   Occupation: Professional Gamer  Tobacco Use   Smoking status: Former    Types: E-cigarettes    Quit date: 10/24/2019    Years since quitting: 3.3   Smokeless tobacco: Never  Vaping Use   Vaping status: Former   Quit date: 10/24/2019   Substances: Nicotine-salt  Substance and Sexual Activity   Alcohol use: No   Drug use: No   Sexual activity: Not on file  Other Topics Concern   Not on file  Social History Narrative   Not on file   Social Determinants of Health   Financial Resource Strain: Not on file  Food Insecurity: Not on file  Transportation Needs: Not on file  Physical Activity: Sufficiently Active (10/27/2017)   Exercise Vital Sign    Days of Exercise per Week: 3 days    Minutes of Exercise per Session: 90 min  Stress: Not on file  Social Connections: Not on file  Intimate Partner Violence: Not on file     Current Outpatient Medications:    amphetamine-dextroamphetamine (ADDERALL XR) 15 MG 24 hr capsule,  Take 1 capsule by mouth every morning., Disp: 30 capsule, Rfl: 0   [START ON 05/03/2023] amphetamine-dextroamphetamine (ADDERALL XR) 15 MG 24 hr capsule, Take 1 capsule by mouth every morning., Disp: 30 capsule, Rfl: 0   [START ON 04/03/2023] amphetamine-dextroamphetamine (ADDERALL XR) 15 MG 24 hr capsule, Take 1 capsule by mouth every morning., Disp: 30 capsule, Rfl: 0  EXAM:  VITALS per patient if applicable: Ht 6' 0.84" (1.85 m)   Wt 235 lb (106.6 kg)   BMI 31.14 kg/m   GENERAL: alert, oriented, appears well and in no acute distress  HEENT: atraumatic, conjunttiva clear, no obvious abnormalities on inspection of external nose and ears  NECK: normal movements of the head and neck  LUNGS: on inspection no signs of  respiratory distress, breathing rate appears normal, no obvious gross SOB, gasping or wheezing  CV: no obvious cyanosis  MS: moves all visible extremities without noticeable abnormality  PSYCH/NEURO: pleasant and cooperative, no obvious depression or anxiety, speech and thought processing grossly intact  ASSESSMENT AND PLAN:  Discussed the following assessment and plan:  ADHD (attention deficit hyperactivity disorder) Doing well with adderall xr 15mg .  Will plan to continue at current strength. Follow up in 6 months.      I discussed the assessment and treatment plan with the patient. The patient was provided an opportunity to ask questions and all were answered. The patient agreed with the plan and demonstrated an understanding of the instructions.   The patient was advised to call back or seek an in-person evaluation if the symptoms worsen or if the condition fails to improve as anticipated.    Maurice Coombe, DO

## 2023-04-21 ENCOUNTER — Other Ambulatory Visit: Payer: Self-pay | Admitting: Family Medicine

## 2023-04-23 ENCOUNTER — Other Ambulatory Visit: Payer: Self-pay | Admitting: Family Medicine

## 2023-04-23 MED ORDER — AMPHETAMINE-DEXTROAMPHET ER 15 MG PO CP24: 15.0000 mg | ORAL_CAPSULE | ORAL | 0 refills | Status: DC

## 2023-07-16 ENCOUNTER — Other Ambulatory Visit: Payer: Self-pay | Admitting: Family Medicine

## 2023-07-20 NOTE — Telephone Encounter (Signed)
 Requesting rx rf of Adderal XR 15mg  Last written 05/03/2023 Last OV 03/04/2023 Upcoming appt = none

## 2023-07-21 MED ORDER — AMPHETAMINE-DEXTROAMPHET ER 15 MG PO CP24: 15.0000 mg | ORAL_CAPSULE | ORAL | 0 refills | Status: DC

## 2023-10-15 ENCOUNTER — Encounter (INDEPENDENT_AMBULATORY_CARE_PROVIDER_SITE_OTHER): Admitting: Family Medicine

## 2023-10-15 DIAGNOSIS — Z91199 Patient's noncompliance with other medical treatment and regimen due to unspecified reason: Secondary | ICD-10-CM

## 2023-10-15 NOTE — Progress Notes (Signed)
 Patient contacted and LVM to contact clinic.  Never connected for virtual visit.  Will mark as No Show.This encounter was created in error - please disregard.

## 2023-10-20 ENCOUNTER — Ambulatory Visit: Admitting: Family Medicine

## 2023-12-07 ENCOUNTER — Other Ambulatory Visit: Payer: Self-pay | Admitting: Family Medicine

## 2023-12-07 NOTE — Telephone Encounter (Unsigned)
 Copied from CRM [id number]. Topic: Clinical - Medication Refill >> Dec 07, 2023 12:38 PM Antonio H wrote: Medication: amphetamine -dextroamphetamine (ADDERALL XR) 15 MG 24 hr capsule (Patient request a 90 day supply if possible)  Has the patient contacted their pharmacy? Yes, pharmacy said the patient had to contact provider  (Agent: If no, request that the patient contact the pharmacy for the refill. If patient does not wish to contact the pharmacy document the reason why and proceed with request.) (Agent: If yes, when and what did the pharmacy advise?)  This is the patient's preferred pharmacy:  [id number] [place] - [address] [place] [address] Phone: [phone] Fax: [phone]   Is this the correct pharmacy for this prescription? Yes If no, delete pharmacy and type the correct one.   Has the prescription been filled recently? No  Is the patient out of the medication? Yes  Has the patient been seen for an appointment in the last year OR does the patient have an upcoming appointment? Yes  Can we respond through MyChart? Yes  Agent: Please be advised that Rx refills may take up to 3 business days. We ask that you follow-up with your pharmacy.

## 2023-12-08 NOTE — Telephone Encounter (Signed)
 Pls contact pt to schedule appt with Dr. Alvia for adderall refills. Thanks

## 2023-12-09 MED ORDER — AMPHETAMINE-DEXTROAMPHET ER 15 MG PO CP24: 15.0000 mg | ORAL_CAPSULE | ORAL | 0 refills | Status: DC

## 2023-12-09 NOTE — Telephone Encounter (Signed)
 Patient is scheduled for 8/27 for amphetamine -dextroamphetamine (ADDERALL XR) 15 MG 24 hr capsule med refill, patient states that he will be out before then, ok to send refill in before then?

## 2023-12-17 ENCOUNTER — Ambulatory Visit (INDEPENDENT_AMBULATORY_CARE_PROVIDER_SITE_OTHER): Payer: Self-pay | Admitting: Family Medicine

## 2023-12-17 VITALS — BP 139/76 | HR 83 | Ht 72.0 in | Wt 236.0 lb

## 2023-12-17 DIAGNOSIS — F909 Attention-deficit hyperactivity disorder, unspecified type: Secondary | ICD-10-CM

## 2023-12-17 MED ORDER — AMPHETAMINE-DEXTROAMPHETAMINE 15 MG PO TABS: 15.0000 mg | ORAL_TABLET | Freq: Two times a day (BID) | ORAL | 0 refills | Status: DC

## 2023-12-17 NOTE — Assessment & Plan Note (Signed)
 He is having some insomnia which may be related to XR adderall.  Changing to IR adderall BID and see if this is better tolerated.

## 2023-12-17 NOTE — Progress Notes (Signed)
 Maurice Cole - 28 y.o. male MRN [id number]  Date of birth: May 27, 1995  Subjective Chief Complaint  Patient presents with   Medical Management of Chronic Issues    HPI Maurice Cole is a 28 y.o. male here today for follow up visit.   He reports that he is doing pretty well.  He continues on adderall xr 15mg  daily.  He report that this has continued to work well for him however he is having some difficulty with sleep.  He feels that the extended release may be lasting too long and doesn't really start to wear off until 7-8pm.  He would like to discuss other options that he could try.    ROS:  A comprehensive ROS was completed and negative except as noted per HPI  Allergies  Allergen Reactions   Clindamycin Hives   Penicillins Hives   Cefaclor     Past Medical History:  Diagnosis Date   ADHD    Obesity 10/27/2017    History reviewed. No pertinent surgical history.  Social History   Socioeconomic History   Marital status: Single    Spouse name: Not on file   Number of children: Not on file   Years of education: Not on file   Highest education level: Some college, no degree  Occupational History   Occupation: Agricultural consultant  Tobacco Use   Smoking status: Former    Types: E-cigarettes    Quit date: 10/24/2019    Years since quitting: 4.1   Smokeless tobacco: Never  Vaping Use   Vaping status: Former   Quit date: 10/24/2019   Substances: Nicotine-salt  Substance and Sexual Activity   Alcohol use: No   Drug use: No   Sexual activity: Not on file  Other Topics Concern   Not on file  Social History Narrative   Not on file   Social Drivers of Health   Financial Resource Strain: Low Risk  (12/17/2023)   Overall Financial Resource Strain (CARDIA)    Difficulty of Paying Living Expenses: Not hard at all  Recent Concern: Financial Resource Strain - Medium Risk (10/14/2023)   Overall Financial Resource Strain (CARDIA)    Difficulty of Paying Living Expenses: Somewhat  hard  Food Insecurity: No Food Insecurity (12/17/2023)   Hunger Vital Sign    Worried About Running Out of Food in the Last Year: Never true    Ran Out of Food in the Last Year: Never true  Transportation Needs: No Transportation Needs (12/17/2023)   PRAPARE - Administrator, Civil Service (Medical): No    Lack of Transportation (Non-Medical): No  Physical Activity: Sufficiently Active (12/17/2023)   Exercise Vital Sign    Days of Exercise per Week: 5 days    Minutes of Exercise per Session: 60 min  Stress: No Stress Concern Present (12/17/2023)   [employer] of Occupational Health - Occupational Stress Questionnaire    Feeling of Stress: Not at all  Social Connections: Moderately Isolated (12/17/2023)   Social Connection and Isolation Panel    Frequency of Communication with Friends and Family: More than three times a week    Frequency of Social Gatherings with Friends and Family: Once a week    Attends Religious Services: 1 to 4 times per year    Active Member of [employer] or Organizations: No    Attends Engineer, structural: Not on file    Marital Status: Never married    Family History  Problem Relation Age of Onset  Obesity Mother    COPD Father    Heart disease Paternal Aunt    Obesity Paternal Aunt    Heart disease Paternal Uncle    Obesity Paternal Uncle    Cancer Maternal Grandfather 5       Brain   Cancer Paternal Grandmother 19       Lung Cancer   COPD Maternal Grandmother    Other Maternal Grandmother        COVID complications    Health Maintenance  Topic Date Due   Hepatitis C Screening  Never done   DTaP/Tdap/Td (7 - Td or Tdap) 11/23/2017   COVID-19 Vaccine (1 - 2024-25 season) Never done   HPV VACCINES (1 - 3-dose SCDM series) Never done   INFLUENZA VACCINE  12/04/2023   Hepatitis B Vaccines 19-59 Average Risk  Completed   HIV Screening  Completed   Pneumococcal Vaccine  Aged Out   Meningococcal B Vaccine  Aged Out      ----------------------------------------------------------------------------------------------------------------------------------------------------------------------------------------------------------------- Physical Exam BP 139/76   Pulse 83   Ht 6' (1.829 m)   Wt 236 lb (107 kg)   SpO2 99%   BMI 32.01 kg/m   Physical Exam Constitutional:      Appearance: Normal appearance.  HENT:     Head: Normocephalic and atraumatic.  Cardiovascular:     Rate and Rhythm: Normal rate and regular rhythm.  Pulmonary:     Effort: Pulmonary effort is normal.     Breath sounds: Normal breath sounds.  Neurological:     General: No focal deficit present.     Mental Status: He is alert.  Psychiatric:        Mood and Affect: Mood normal.        Behavior: Behavior normal.     ------------------------------------------------------------------------------------------------------------------------------------------------------------------------------------------------------------------- Assessment and Plan  No problem-specific Assessment & Plan notes found for this encounter.   No orders of the defined types were placed in this encounter.   No follow-ups on file.

## 2023-12-30 ENCOUNTER — Ambulatory Visit: Admitting: Family Medicine

## 2024-01-18 ENCOUNTER — Encounter: Payer: Self-pay | Admitting: Family Medicine

## 2024-01-18 MED ORDER — AMPHETAMINE-DEXTROAMPHETAMINE 15 MG PO TABS: 15.0000 mg | ORAL_TABLET | Freq: Two times a day (BID) | ORAL | 0 refills | Status: DC

## 2024-01-20 MED ORDER — AMPHETAMINE-DEXTROAMPHET ER 15 MG PO CP24: 15.0000 mg | ORAL_CAPSULE | ORAL | 0 refills | Status: DC

## 2024-01-20 NOTE — Addendum Note (Signed)
 Addended by: Teliyah Royal E on: 01/20/2024 12:49 PM   Modules accepted: Orders

## 2024-03-09 ENCOUNTER — Other Ambulatory Visit: Payer: Self-pay | Admitting: Family Medicine

## 2024-03-09 NOTE — Telephone Encounter (Unsigned)
 Copied from CRM #[id number]. Topic: Clinical - Medication Refill >> Mar 09, 2024  9:14 AM Gustabo D wrote: Medication: amphetamine -dextroamphetamine  (ADDERALL XR) 15 MG 24 hr capsule amphetamine -dextroamphetamine  (ADDERALL) 15 MG tablet    Has the patient contacted their pharmacy? Yes (Agent: If no, request that the patient contact the pharmacy for the refill. If patient does not wish to contact the pharmacy document the reason why and proceed with request.) (Agent: If yes, when and what did the pharmacy advise?)  This is the patient's preferred pharmacy:  [id number] [place] - [address] [place] [address] Phone: [phone] Fax: [phone]    Is this the correct pharmacy for this prescription? Yes If no, delete pharmacy and type the correct one.   Has the prescription been filled recently? No  Is the patient out of the medication? Yes  Has the patient been seen for an appointment in the last year OR does the patient have an upcoming appointment? Yes  Can we respond through MyChart? Yes  Agent: Please be advised that Rx refills may take up to 3 business days. We ask that you follow-up with your pharmacy.

## 2024-03-11 MED ORDER — AMPHETAMINE-DEXTROAMPHETAMINE 15 MG PO TABS: 15.0000 mg | ORAL_TABLET | Freq: Two times a day (BID) | ORAL | 0 refills | Status: DC

## 2024-03-11 MED ORDER — AMPHETAMINE-DEXTROAMPHET ER 15 MG PO CP24: 15.0000 mg | ORAL_CAPSULE | ORAL | 0 refills | Status: DC

## 2024-04-11 ENCOUNTER — Telehealth: Payer: Self-pay | Admitting: Family Medicine

## 2024-04-11 NOTE — Telephone Encounter (Unsigned)
 Copied from CRM #[id number]. Topic: Clinical - Medication Refill >> Apr 11, 2024 10:57 AM Fonda T wrote: Medication:  amphetamine -dextroamphetamine  (ADDERALL XR) 15 MG 24 hr capsule  amphetamine -dextroamphetamine  (ADDERALL) 15 MG tablet  Pt requesting a 3 month supply to be dispensed 1 month at a time   Has the patient contacted their pharmacy? Yes, advised to contact office   This is the patient's preferred pharmacy:  [id number] [place] - [address] [place] [address] Phone: [phone] Fax: [phone]   Is this the correct pharmacy for this prescription? Yes If no, delete pharmacy and type the correct one.   Has the prescription been filled recently? Yes  Is the patient out of the medication? Yes  Has the patient been seen for an appointment in the last year OR does the patient have an upcoming appointment? Yes  Can we respond through MyChart? No, prefers phone call to [phone]   Agent: Please be advised that Rx refills may take up to 3 business days. We ask that you follow-up with your pharmacy.

## 2024-04-15 MED ORDER — AMPHETAMINE-DEXTROAMPHET ER 15 MG PO CP24: 15.0000 mg | ORAL_CAPSULE | ORAL | 0 refills | Status: AC

## 2024-04-15 MED ORDER — AMPHETAMINE-DEXTROAMPHETAMINE 15 MG PO TABS: 15.0000 mg | ORAL_TABLET | Freq: Two times a day (BID) | ORAL | 0 refills | Status: DC

## 2024-06-06 ENCOUNTER — Other Ambulatory Visit: Payer: Self-pay

## 2024-06-08 MED ORDER — AMPHETAMINE-DEXTROAMPHETAMINE 15 MG PO TABS: 15.0000 mg | ORAL_TABLET | Freq: Two times a day (BID) | ORAL | 0 refills | Status: AC

## 2024-06-20 ENCOUNTER — Ambulatory Visit: Payer: Self-pay | Admitting: Family Medicine
